# Patient Record
Sex: Male | Born: 1990 | Race: Black or African American | Hispanic: No | Marital: Single | Smoking: Never smoker
Health system: Southern US, Community
[De-identification: ages and names within clinical notes are randomized; demographics above are authoritative.]

---
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
## 2016-03-11 ENCOUNTER — Encounter (HOSPITAL_COMMUNITY): Payer: Self-pay | Admitting: Emergency Medicine

## 2016-03-11 ENCOUNTER — Ambulatory Visit (HOSPITAL_COMMUNITY)
Admission: EM | Admit: 2016-03-11 | Discharge: 2016-03-11 | Disposition: A | Discharge status: Home / Self Care | Payer: 59 | Attending: Family Medicine | Admitting: Family Medicine

## 2016-03-11 DIAGNOSIS — K529 Noninfective gastroenteritis and colitis, unspecified: Secondary | ICD-10-CM | POA: Diagnosis not present

## 2016-03-11 NOTE — Discharge Instructions (Signed)
If you develop fevers, you are unable to stay hydrated, your abdominal pain worsens, or you are vomiting greater amounts of blood or blood in your stool, please seek immediate medical attention.

## 2016-03-11 NOTE — ED Provider Notes (Signed)
CSN: [id number]     Arrival date & time 03/11/16  1259 History   First MD Initiated Contact with Patient 03/11/16 1431     Chief Complaint  Patient presents with  . Abdominal Pain  . Nausea  . Emesis   (Consider location/radiation/quality/duration/timing/severity/associated sxs/prior Treatment) HPI  Patient reports nausea with vomiting x2 yesterday and once this morning.  He reports mid to left sided abdominal pain that is improving but still present.  No know sick contacts.  May have eaten something left out too long on Sunday (ground [place]).  He was the only one who ate food.  Denies fevers, chills, diarrhea, dysuria, hematuria, headaches, weakness.  Vomit with scant specks of blood the second time he vomit.  He is able to stay hydrated.  He was able to tolerate rice at work last evening.  Denies nausea right now.  He's been trying to keep cool and eat bland things to settle his stomach.  Patient is borderline DM.  No ETOH, drugs or smoking.  History reviewed. No pertinent past medical history. History reviewed. No pertinent surgical history. History reviewed. No pertinent family history. Social History  Substance Use Topics  . Smoking status: Never Smoker  . Smokeless tobacco: Never Used  . Alcohol use No    Review of Systems  Constitutional: Positive for appetite change. Negative for chills, diaphoresis, fatigue and fever.  Eyes: Negative for visual disturbance.  Gastrointestinal: Positive for abdominal pain (improving), nausea (now resolved) and vomiting (now resolved). Negative for blood in stool, constipation and diarrhea.  Genitourinary: Negative for dysuria, frequency and hematuria.  Skin: Negative for rash.  Neurological: Negative for weakness and headaches.    Allergies  Review of patient's allergies indicates no known allergies.  Home Medications   Prior to Admission medications   Not on File   Meds Ordered and Administered this Visit  Medications - No data to  display  BP 130/74 (BP Location: Left Arm)   Pulse 65   Temp 98 F (36.7 C) (Oral)   Resp 18   Ht 6' (1.829 m)   Wt 260 lb (117.9 kg)   SpO2 99%   BMI 35.26 kg/m  No data found.   Physical Exam  Constitutional: He is oriented to person, place, and time. He appears well-developed and well-nourished. No distress.  obese  HENT:  Head: Normocephalic and atraumatic.  Eyes: Conjunctivae and EOM are normal.  Neck: Normal range of motion.  Cardiovascular: Normal rate, regular rhythm and normal heart sounds.   No murmur heard. Pulmonary/Chest: Effort normal and breath sounds normal. No respiratory distress.  Abdominal: Soft. Bowel sounds are normal. He exhibits no distension and no mass. There is no tenderness. There is no rebound and no guarding.  protuberant   Musculoskeletal: Normal range of motion.  Neurological: He is alert and oriented to person, place, and time.  Skin: Skin is warm. He is not diaphoretic.  Psychiatric: He has a normal mood and affect.  Poor eye contact    Urgent Care Course   Clinical Course   Procedures (including critical care time)  Labs Review Labs Reviewed - No data to display  Imaging Review No results found.  MDM   1. Gastroenteritis    Edward Woods is a 25 y.o. male that [facility] 2 days of nausea, vomiting, abdominal pain.  Upon exam, symptoms had resolved.  He wanted to be evaluated to be cleared for work because he works in [employer].  Patient afebrile here and asymptomatic.  His exam was unremarkable.  Possible food borne illness (reported left out ground [place]) vs viral gastroenteritis.  Work note provided excusing for missed work yesterday and allowing to return today.  Discussed washing hands frequently.  Reviewed strict return precautions, see AVS.  This patient was discussed with Edward Rasmussenavid Mabe, NP, who agrees with my assessment and plan.  Edward Meditz M. Nadine CountsGottschalk, DO PGY-3, [facility]         Raliegh IpAshly M Labib Cwynar, DO 03/11/16 [phone]

## 2016-03-11 NOTE — ED Triage Notes (Signed)
Pt reports waking up yesterday with nausea and some mild, dull abdominal pain.  He states he vomited at work twice yesterday and was sent home and then vomited again this morning.  Pt needs a note so he can go back to work.  He denies any nausea at this time, but still has a dull ache in his abdomen.

## 2016-05-06 ENCOUNTER — Ambulatory Visit (HOSPITAL_COMMUNITY)
Admission: EM | Admit: 2016-05-06 | Discharge: 2016-05-06 | Disposition: A | Discharge status: Home / Self Care | Payer: Worker's Compensation | Attending: Family Medicine | Admitting: Family Medicine

## 2016-05-06 ENCOUNTER — Encounter (HOSPITAL_COMMUNITY): Payer: Self-pay | Admitting: Emergency Medicine

## 2016-05-06 DIAGNOSIS — T23002A Burn of unspecified degree of left hand, unspecified site, initial encounter: Secondary | ICD-10-CM

## 2016-05-06 DIAGNOSIS — Z23 Encounter for immunization: Secondary | ICD-10-CM

## 2016-05-06 MED ORDER — TETANUS-DIPHTH-ACELL PERTUSSIS 5-2.5-18.5 LF-MCG/0.5 IM SUSP
INTRAMUSCULAR | Status: AC
  Filled 2016-05-06: qty 0.5

## 2016-05-06 MED ORDER — TETANUS-DIPHTH-ACELL PERTUSSIS 5-2.5-18.5 LF-MCG/0.5 IM SUSP
0.5000 mL | Freq: Once | INTRAMUSCULAR | Status: AC
  Administered 2016-05-06: 0.5 mL via INTRAMUSCULAR

## 2016-05-06 MED ORDER — SILVER SULFADIAZINE 1 % EX CREA: 1.0000 "application " | TOPICAL_CREAM | Freq: Every day | CUTANEOUS | 0 refills | Status: AC
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
## 2018-07-17 ENCOUNTER — Other Ambulatory Visit: Payer: Self-pay

## 2018-07-17 ENCOUNTER — Emergency Department (HOSPITAL_COMMUNITY)
Admission: EM | Admit: 2018-07-17 | Discharge: 2018-07-17 | Disposition: A | Discharge status: Home / Self Care | Payer: Self-pay | Attending: Emergency Medicine | Admitting: Emergency Medicine

## 2018-07-17 ENCOUNTER — Emergency Department (HOSPITAL_COMMUNITY): Payer: Self-pay

## 2018-07-17 DIAGNOSIS — M25571 Pain in right ankle and joints of right foot: Secondary | ICD-10-CM | POA: Insufficient documentation

## 2018-07-17 DIAGNOSIS — F909 Attention-deficit hyperactivity disorder, unspecified type: Secondary | ICD-10-CM | POA: Insufficient documentation

## 2018-07-17 NOTE — ED Notes (Signed)
Patient verbalizes understanding of discharge instructions. Opportunity for questioning and answers were provided. Armband removed by staff, pt discharged from ED.  

## 2018-07-17 NOTE — Discharge Instructions (Signed)
Elevate and ice when you can.  Take ibuprofen or aleve for the pain
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
## 2019-12-01 ENCOUNTER — Inpatient Hospital Stay: Payer: Self-pay

## 2019-12-01 ENCOUNTER — Inpatient Hospital Stay (HOSPITAL_COMMUNITY): Payer: Medicaid Other

## 2019-12-01 ENCOUNTER — Inpatient Hospital Stay (HOSPITAL_COMMUNITY)
Admission: EM | Admit: 2019-12-01 | Discharge: 2019-12-20 | DRG: 871 | Disposition: E | Payer: Medicaid Other | Attending: Pulmonary Disease | Admitting: Pulmonary Disease

## 2019-12-01 ENCOUNTER — Emergency Department (HOSPITAL_COMMUNITY): Payer: Medicaid Other

## 2019-12-01 ENCOUNTER — Encounter (HOSPITAL_COMMUNITY): Payer: Self-pay | Admitting: Emergency Medicine

## 2019-12-01 ENCOUNTER — Other Ambulatory Visit: Payer: Self-pay

## 2019-12-01 DIAGNOSIS — K8581 Other acute pancreatitis with uninfected necrosis: Secondary | ICD-10-CM | POA: Diagnosis present

## 2019-12-01 DIAGNOSIS — E875 Hyperkalemia: Secondary | ICD-10-CM

## 2019-12-01 DIAGNOSIS — E860 Dehydration: Secondary | ICD-10-CM | POA: Diagnosis present

## 2019-12-01 DIAGNOSIS — E781 Pure hyperglyceridemia: Secondary | ICD-10-CM | POA: Diagnosis present

## 2019-12-01 DIAGNOSIS — Z20822 Contact with and (suspected) exposure to covid-19: Secondary | ICD-10-CM | POA: Diagnosis present

## 2019-12-01 DIAGNOSIS — R6521 Severe sepsis with septic shock: Secondary | ICD-10-CM | POA: Diagnosis present

## 2019-12-01 DIAGNOSIS — Z4659 Encounter for fitting and adjustment of other gastrointestinal appliance and device: Secondary | ICD-10-CM

## 2019-12-01 DIAGNOSIS — A419 Sepsis, unspecified organism: Principal | ICD-10-CM | POA: Diagnosis present

## 2019-12-01 DIAGNOSIS — F909 Attention-deficit hyperactivity disorder, unspecified type: Secondary | ICD-10-CM | POA: Diagnosis present

## 2019-12-01 DIAGNOSIS — Z538 Procedure and treatment not carried out for other reasons: Secondary | ICD-10-CM | POA: Diagnosis present

## 2019-12-01 DIAGNOSIS — R571 Hypovolemic shock: Secondary | ICD-10-CM | POA: Diagnosis present

## 2019-12-01 DIAGNOSIS — K7290 Hepatic failure, unspecified without coma: Secondary | ICD-10-CM | POA: Diagnosis present

## 2019-12-01 DIAGNOSIS — R748 Abnormal levels of other serum enzymes: Secondary | ICD-10-CM

## 2019-12-01 DIAGNOSIS — K859 Acute pancreatitis without necrosis or infection, unspecified: Secondary | ICD-10-CM

## 2019-12-01 DIAGNOSIS — E870 Hyperosmolality and hypernatremia: Secondary | ICD-10-CM | POA: Diagnosis present

## 2019-12-01 DIAGNOSIS — Z66 Do not resuscitate: Secondary | ICD-10-CM | POA: Diagnosis not present

## 2019-12-01 DIAGNOSIS — Z6841 Body Mass Index (BMI) 40.0 and over, adult: Secondary | ICD-10-CM

## 2019-12-01 DIAGNOSIS — Z452 Encounter for adjustment and management of vascular access device: Secondary | ICD-10-CM

## 2019-12-01 DIAGNOSIS — E43 Unspecified severe protein-calorie malnutrition: Secondary | ICD-10-CM | POA: Diagnosis present

## 2019-12-01 DIAGNOSIS — E1110 Type 2 diabetes mellitus with ketoacidosis without coma: Secondary | ICD-10-CM

## 2019-12-01 DIAGNOSIS — G9340 Encephalopathy, unspecified: Secondary | ICD-10-CM | POA: Insufficient documentation

## 2019-12-01 DIAGNOSIS — G9341 Metabolic encephalopathy: Secondary | ICD-10-CM | POA: Diagnosis present

## 2019-12-01 DIAGNOSIS — N170 Acute kidney failure with tubular necrosis: Secondary | ICD-10-CM | POA: Diagnosis present

## 2019-12-01 DIAGNOSIS — Z978 Presence of other specified devices: Secondary | ICD-10-CM

## 2019-12-01 DIAGNOSIS — E11649 Type 2 diabetes mellitus with hypoglycemia without coma: Secondary | ICD-10-CM | POA: Diagnosis not present

## 2019-12-01 DIAGNOSIS — K760 Fatty (change of) liver, not elsewhere classified: Secondary | ICD-10-CM | POA: Diagnosis present

## 2019-12-01 DIAGNOSIS — Z01818 Encounter for other preprocedural examination: Secondary | ICD-10-CM

## 2019-12-01 DIAGNOSIS — D696 Thrombocytopenia, unspecified: Secondary | ICD-10-CM | POA: Diagnosis present

## 2019-12-01 DIAGNOSIS — N179 Acute kidney failure, unspecified: Secondary | ICD-10-CM

## 2019-12-01 DIAGNOSIS — J9601 Acute respiratory failure with hypoxia: Secondary | ICD-10-CM | POA: Diagnosis not present

## 2019-12-01 DIAGNOSIS — J9600 Acute respiratory failure, unspecified whether with hypoxia or hypercapnia: Secondary | ICD-10-CM

## 2019-12-01 DIAGNOSIS — I480 Paroxysmal atrial fibrillation: Secondary | ICD-10-CM | POA: Diagnosis not present

## 2019-12-01 DIAGNOSIS — E8770 Fluid overload, unspecified: Secondary | ICD-10-CM | POA: Diagnosis not present

## 2019-12-01 DIAGNOSIS — Z79899 Other long term (current) drug therapy: Secondary | ICD-10-CM

## 2019-12-01 LAB — CBC
HCT: 54.9 % — ABNORMAL HIGH (ref 39.0–52.0)
Hemoglobin: 17.8 g/dL — ABNORMAL HIGH (ref 13.0–17.0)
MCH: 28.2 pg (ref 26.0–34.0)
MCHC: 32.4 g/dL (ref 30.0–36.0)
MCV: 86.9 fL (ref 80.0–100.0)
Platelets: 462 10*3/uL — ABNORMAL HIGH (ref 150–400)
RBC: 6.32 MIL/uL — ABNORMAL HIGH (ref 4.22–5.81)
RDW: 14.9 % (ref 11.5–15.5)
WBC: 18.9 10*3/uL — ABNORMAL HIGH (ref 4.0–10.5)
nRBC: 0 % (ref 0.0–0.2)

## 2019-12-01 LAB — BLOOD GAS, ARTERIAL
Acid-base deficit: 13.3 mmol/L — ABNORMAL HIGH (ref 0.0–2.0)
Bicarbonate: 12.4 mmol/L — ABNORMAL LOW (ref 20.0–28.0)
FIO2: 21
O2 Saturation: 95.3 %
Patient temperature: 98.7
pCO2 arterial: 28.7 mmHg — ABNORMAL LOW (ref 32.0–48.0)
pH, Arterial: 7.258 — ABNORMAL LOW (ref 7.350–7.450)
pO2, Arterial: 83.6 mmHg (ref 83.0–108.0)

## 2019-12-01 LAB — URINALYSIS, ROUTINE W REFLEX MICROSCOPIC
Bacteria, UA: NONE SEEN
Bilirubin Urine: NEGATIVE
Glucose, UA: >=500 mg/dL — AB
Hgb urine dipstick: NEGATIVE
Ketones, ur: 80 mg/dL — AB
Leukocytes,Ua: NEGATIVE
Nitrite: NEGATIVE
Protein, ur: >=300 mg/dL — AB
Specific Gravity, Urine: 1.033 — ABNORMAL HIGH (ref 1.005–1.030)
pH: 6 (ref 5.0–8.0)

## 2019-12-01 LAB — BASIC METABOLIC PANEL
Anion gap: 31 — ABNORMAL HIGH (ref 5–15)
Anion gap: 27 — ABNORMAL HIGH (ref 5–15)
Anion gap: 24 — ABNORMAL HIGH (ref 5–15)
Anion gap: 16 — ABNORMAL HIGH (ref 5–15)
BUN: 31 mg/dL — ABNORMAL HIGH (ref 6–20)
BUN: 33 mg/dL — ABNORMAL HIGH (ref 6–20)
BUN: 31 mg/dL — ABNORMAL HIGH (ref 6–20)
BUN: 33 mg/dL — ABNORMAL HIGH (ref 6–20)
CO2: 10 mmol/L — ABNORMAL LOW (ref 22–32)
CO2: 9 mmol/L — ABNORMAL LOW (ref 22–32)
CO2: 11 mmol/L — ABNORMAL LOW (ref 22–32)
CO2: 17 mmol/L — ABNORMAL LOW (ref 22–32)
Calcium: 10.2 mg/dL (ref 8.9–10.3)
Calcium: 8.7 mg/dL — ABNORMAL LOW (ref 8.9–10.3)
Calcium: 8.6 mg/dL — ABNORMAL LOW (ref 8.9–10.3)
Calcium: 9 mg/dL (ref 8.9–10.3)
Chloride: 99 mmol/L (ref 98–111)
Chloride: 110 mmol/L (ref 98–111)
Chloride: 115 mmol/L — ABNORMAL HIGH (ref 98–111)
Chloride: 122 mmol/L — ABNORMAL HIGH (ref 98–111)
Creatinine, Ser: 3.11 mg/dL — ABNORMAL HIGH (ref 0.61–1.24)
Creatinine, Ser: 2.46 mg/dL — ABNORMAL HIGH (ref 0.61–1.24)
Creatinine, Ser: 2.33 mg/dL — ABNORMAL HIGH (ref 0.61–1.24)
Creatinine, Ser: 3.36 mg/dL — ABNORMAL HIGH (ref 0.61–1.24)
GFR calc Af Amer: 30 mL/min — ABNORMAL LOW (ref >60)
GFR calc Af Amer: 40 mL/min — ABNORMAL LOW (ref >60)
GFR calc Af Amer: 43 mL/min — ABNORMAL LOW (ref >60)
GFR calc Af Amer: 27 mL/min — ABNORMAL LOW (ref >60)
GFR calc non Af Amer: 26 mL/min — ABNORMAL LOW (ref >60)
GFR calc non Af Amer: 35 mL/min — ABNORMAL LOW (ref >60)
GFR calc non Af Amer: 37 mL/min — ABNORMAL LOW (ref >60)
GFR calc non Af Amer: 24 mL/min — ABNORMAL LOW (ref >60)
Glucose, Bld: 926 mg/dL (ref 70–99)
Glucose, Bld: 736 mg/dL (ref 70–99)
Glucose, Bld: 519 mg/dL (ref 70–99)
Glucose, Bld: 290 mg/dL — ABNORMAL HIGH (ref 70–99)
Potassium: 6 mmol/L — ABNORMAL HIGH (ref 3.5–5.1)
Potassium: 4.5 mmol/L (ref 3.5–5.1)
Potassium: 4 mmol/L (ref 3.5–5.1)
Potassium: 3.9 mmol/L (ref 3.5–5.1)
Sodium: 140 mmol/L (ref 135–145)
Sodium: 146 mmol/L — ABNORMAL HIGH (ref 135–145)
Sodium: 150 mmol/L — ABNORMAL HIGH (ref 135–145)
Sodium: 155 mmol/L — ABNORMAL HIGH (ref 135–145)

## 2019-12-01 LAB — CBG MONITORING, ED
Glucose-Capillary: >600 mg/dL (ref 70–99)
Glucose-Capillary: >600 mg/dL (ref 70–99)
Glucose-Capillary: >600 mg/dL (ref 70–99)
Glucose-Capillary: >600 mg/dL (ref 70–99)
Glucose-Capillary: >600 mg/dL (ref 70–99)
Glucose-Capillary: 546 mg/dL (ref 70–99)
Glucose-Capillary: 548 mg/dL (ref 70–99)
Glucose-Capillary: 459 mg/dL — ABNORMAL HIGH (ref 70–99)
Glucose-Capillary: 410 mg/dL — ABNORMAL HIGH (ref 70–99)
Glucose-Capillary: 450 mg/dL — ABNORMAL HIGH (ref 70–99)
Glucose-Capillary: 384 mg/dL — ABNORMAL HIGH (ref 70–99)
Glucose-Capillary: 412 mg/dL — ABNORMAL HIGH (ref 70–99)
Glucose-Capillary: 308 mg/dL — ABNORMAL HIGH (ref 70–99)
Glucose-Capillary: 242 mg/dL — ABNORMAL HIGH (ref 70–99)
Glucose-Capillary: 234 mg/dL — ABNORMAL HIGH (ref 70–99)
Glucose-Capillary: 245 mg/dL — ABNORMAL HIGH (ref 70–99)
Glucose-Capillary: 256 mg/dL — ABNORMAL HIGH (ref 70–99)
Glucose-Capillary: 296 mg/dL — ABNORMAL HIGH (ref 70–99)
Glucose-Capillary: 308 mg/dL — ABNORMAL HIGH (ref 70–99)

## 2019-12-01 LAB — BLOOD GAS, VENOUS
Acid-base deficit: 20.1 mmol/L — ABNORMAL HIGH (ref 0.0–2.0)
Acid-base deficit: 21.3 mmol/L — ABNORMAL HIGH (ref 0.0–2.0)
Acid-base deficit: 15.4 mmol/L — ABNORMAL HIGH (ref 0.0–2.0)
Bicarbonate: 10.4 mmol/L — ABNORMAL LOW (ref 20.0–28.0)
Bicarbonate: 8.5 mmol/L — ABNORMAL LOW (ref 20.0–28.0)
Bicarbonate: 12.3 mmol/L — ABNORMAL LOW (ref 20.0–28.0)
FIO2: 21
FIO2: 21
O2 Saturation: 69.1 %
O2 Saturation: 76.3 %
O2 Saturation: 70.8 %
Patient temperature: 98.7
Patient temperature: 98.6
Patient temperature: 98.6
pCO2, Ven: 35.6 mmHg — ABNORMAL LOW (ref 44.0–60.0)
pCO2, Ven: 29.4 mmHg — ABNORMAL LOW (ref 44.0–60.0)
pCO2, Ven: 34 mmHg — ABNORMAL LOW (ref 44.0–60.0)
pH, Ven: 7.093 — CL (ref 7.250–7.430)
pH, Ven: 7.092 — CL (ref 7.250–7.430)
pH, Ven: 7.185 — CL (ref 7.250–7.430)
pO2, Ven: 46.3 mmHg — ABNORMAL HIGH (ref 32.0–45.0)
pO2, Ven: 56.5 mmHg — ABNORMAL HIGH (ref 32.0–45.0)
pO2, Ven: 42 mmHg (ref 32.0–45.0)

## 2019-12-01 LAB — PHOSPHORUS
Phosphorus: 2.8 mg/dL (ref 2.5–4.6)
Phosphorus: 2.1 mg/dL — ABNORMAL LOW (ref 2.5–4.6)

## 2019-12-01 LAB — COMPREHENSIVE METABOLIC PANEL
ALT: 50 U/L — ABNORMAL HIGH (ref 0–44)
ALT: 35 U/L (ref 0–44)
AST: 28 U/L (ref 15–41)
AST: 36 U/L (ref 15–41)
Albumin: 4.8 g/dL (ref 3.5–5.0)
Albumin: 3.5 g/dL (ref 3.5–5.0)
Alkaline Phosphatase: 97 U/L (ref 38–126)
Alkaline Phosphatase: 68 U/L (ref 38–126)
Anion gap: 30 — ABNORMAL HIGH (ref 5–15)
Anion gap: 18 — ABNORMAL HIGH (ref 5–15)
BUN: 32 mg/dL — ABNORMAL HIGH (ref 6–20)
BUN: 39 mg/dL — ABNORMAL HIGH (ref 6–20)
CO2: 9 mmol/L — ABNORMAL LOW (ref 22–32)
CO2: 13 mmol/L — ABNORMAL LOW (ref 22–32)
Calcium: 10.3 mg/dL (ref 8.9–10.3)
Calcium: 8.7 mg/dL — ABNORMAL LOW (ref 8.9–10.3)
Chloride: 101 mmol/L (ref 98–111)
Chloride: 120 mmol/L — ABNORMAL HIGH (ref 98–111)
Creatinine, Ser: 2.78 mg/dL — ABNORMAL HIGH (ref 0.61–1.24)
Creatinine, Ser: 4.56 mg/dL — ABNORMAL HIGH (ref 0.61–1.24)
GFR calc Af Amer: 35 mL/min — ABNORMAL LOW (ref >60)
GFR calc Af Amer: 19 mL/min — ABNORMAL LOW (ref >60)
GFR calc non Af Amer: 30 mL/min — ABNORMAL LOW (ref >60)
GFR calc non Af Amer: 16 mL/min — ABNORMAL LOW (ref >60)
Glucose, Bld: 908 mg/dL (ref 70–99)
Glucose, Bld: 415 mg/dL — ABNORMAL HIGH (ref 70–99)
Potassium: 6.5 mmol/L (ref 3.5–5.1)
Potassium: 4.4 mmol/L (ref 3.5–5.1)
Sodium: 140 mmol/L (ref 135–145)
Sodium: 151 mmol/L — ABNORMAL HIGH (ref 135–145)
Total Bilirubin: 2.4 mg/dL — ABNORMAL HIGH (ref 0.3–1.2)
Total Bilirubin: 1.4 mg/dL — ABNORMAL HIGH (ref 0.3–1.2)
Total Protein: 9.7 g/dL — ABNORMAL HIGH (ref 6.5–8.1)
Total Protein: 7.2 g/dL (ref 6.5–8.1)

## 2019-12-01 LAB — CK: Total CK: 326 U/L (ref 49–397)

## 2019-12-01 LAB — TRIGLYCERIDES: Triglycerides: 1136 mg/dL — ABNORMAL HIGH (ref <150)

## 2019-12-01 LAB — HEMOGLOBIN A1C
Hgb A1c MFr Bld: 11.6 % — ABNORMAL HIGH (ref 4.8–5.6)
Mean Plasma Glucose: 286.22 mg/dL

## 2019-12-01 LAB — LACTIC ACID, PLASMA
Lactic Acid, Venous: 3.9 mmol/L (ref 0.5–1.9)
Lactic Acid, Venous: 3.6 mmol/L (ref 0.5–1.9)
Lactic Acid, Venous: 3.2 mmol/L (ref 0.5–1.9)
Lactic Acid, Venous: 5 mmol/L (ref 0.5–1.9)
Lactic Acid, Venous: 4 mmol/L (ref 0.5–1.9)

## 2019-12-01 LAB — CBC WITH DIFFERENTIAL/PLATELET
Abs Immature Granulocytes: 0.12 10*3/uL — ABNORMAL HIGH (ref 0.00–0.07)
Basophils Absolute: 0.1 10*3/uL (ref 0.0–0.1)
Basophils Relative: 0 %
Eosinophils Absolute: 0 10*3/uL (ref 0.0–0.5)
Eosinophils Relative: 0 %
HCT: 57.4 % — ABNORMAL HIGH (ref 39.0–52.0)
Hemoglobin: 18.6 g/dL — ABNORMAL HIGH (ref 13.0–17.0)
Immature Granulocytes: 1 %
Lymphocytes Relative: 10 %
Lymphs Abs: 2.1 10*3/uL (ref 0.7–4.0)
MCH: 28.1 pg (ref 26.0–34.0)
MCHC: 32.4 g/dL (ref 30.0–36.0)
MCV: 86.7 fL (ref 80.0–100.0)
Monocytes Absolute: 1.5 10*3/uL — ABNORMAL HIGH (ref 0.1–1.0)
Monocytes Relative: 8 %
Neutro Abs: 16.7 10*3/uL — ABNORMAL HIGH (ref 1.7–7.7)
Neutrophils Relative %: 81 %
Platelets: 540 10*3/uL — ABNORMAL HIGH (ref 150–400)
RBC: 6.62 MIL/uL — ABNORMAL HIGH (ref 4.22–5.81)
RDW: 15.4 % (ref 11.5–15.5)
WBC: 20.5 10*3/uL — ABNORMAL HIGH (ref 4.0–10.5)
nRBC: 0 % (ref 0.0–0.2)

## 2019-12-01 LAB — LIPASE, BLOOD: Lipase: 342 U/L — ABNORMAL HIGH (ref 11–51)

## 2019-12-01 LAB — MAGNESIUM
Magnesium: 3.1 mg/dL — ABNORMAL HIGH (ref 1.7–2.4)
Magnesium: 2.7 mg/dL — ABNORMAL HIGH (ref 1.7–2.4)

## 2019-12-01 LAB — BETA-HYDROXYBUTYRIC ACID
Beta-Hydroxybutyric Acid: >8 mmol/L — ABNORMAL HIGH (ref 0.05–0.27)
Beta-Hydroxybutyric Acid: 3.19 mmol/L — ABNORMAL HIGH (ref 0.05–0.27)

## 2019-12-01 LAB — SARS CORONAVIRUS 2 BY RT PCR (HOSPITAL ORDER, PERFORMED IN ~~LOC~~ HOSPITAL LAB): SARS Coronavirus 2: NEGATIVE

## 2019-12-01 LAB — HIV ANTIBODY (ROUTINE TESTING W REFLEX): HIV Screen 4th Generation wRfx: NONREACTIVE

## 2019-12-01 LAB — D-DIMER, QUANTITATIVE: D-Dimer, Quant: 0.62 ug/mL-FEU — ABNORMAL HIGH (ref 0.00–0.50)

## 2019-12-01 LAB — GLUCOSE, CAPILLARY
Glucose-Capillary: 387 mg/dL — ABNORMAL HIGH (ref 70–99)
Glucose-Capillary: 329 mg/dL — ABNORMAL HIGH (ref 70–99)

## 2019-12-01 LAB — TROPONIN I (HIGH SENSITIVITY)
Troponin I (High Sensitivity): 4 ng/L (ref <18)
Troponin I (High Sensitivity): 4 ng/L (ref <18)

## 2019-12-01 MED ORDER — LACTATED RINGERS IV BOLUS
500.0000 mL | Freq: Once | INTRAVENOUS | Status: AC
  Administered 2019-12-01: 500 mL via INTRAVENOUS

## 2019-12-01 MED ORDER — LACTATED RINGERS IV BOLUS: 1000.0000 mL | Freq: Once | INTRAVENOUS | Status: DC

## 2019-12-01 MED ORDER — POTASSIUM CHLORIDE 10 MEQ/100ML IV SOLN
10.0000 meq | INTRAVENOUS | Status: AC
  Administered 2019-12-01 (×2): 10 meq via INTRAVENOUS
  Filled 2019-12-01: qty 100

## 2019-12-01 MED ORDER — INSULIN REGULAR(HUMAN) IN NACL 100-0.9 UT/100ML-% IV SOLN
INTRAVENOUS | Status: DC
  Administered 2019-12-01: 17 [IU]/h via INTRAVENOUS
  Administered 2019-12-01: 5 [IU]/h via INTRAVENOUS
  Administered 2019-12-02: 15 [IU]/h via INTRAVENOUS
  Filled 2019-12-01 (×5): qty 100

## 2019-12-01 MED ORDER — LACTATED RINGERS IV BOLUS
1000.0000 mL | INTRAVENOUS | Status: DC
  Administered 2019-12-01: 800 mL via INTRAVENOUS
  Administered 2019-12-01: 1000 mL via INTRAVENOUS

## 2019-12-01 MED ORDER — SODIUM CHLORIDE 0.9 % IV SOLN
1.0000 g | Freq: Two times a day (BID) | INTRAVENOUS | Status: DC
  Administered 2019-12-02 (×2): 1 g via INTRAVENOUS
  Filled 2019-12-01 (×3): qty 1

## 2019-12-01 MED ORDER — ONDANSETRON HCL 4 MG/2ML IJ SOLN
4.0000 mg | Freq: Four times a day (QID) | INTRAMUSCULAR | Status: DC | PRN
  Administered 2019-12-01 – 2019-12-04 (×3): 4 mg via INTRAVENOUS
  Filled 2019-12-01 (×3): qty 2

## 2019-12-01 MED ORDER — ONDANSETRON HCL 4 MG/2ML IJ SOLN
4.0000 mg | Freq: Once | INTRAMUSCULAR | Status: AC
  Administered 2019-12-01: 4 mg via INTRAVENOUS
  Filled 2019-12-01: qty 2

## 2019-12-01 MED ORDER — SODIUM CHLORIDE 0.9 % IV BOLUS: 1000.0000 mL | Freq: Once | INTRAVENOUS | Status: DC

## 2019-12-01 MED ORDER — SODIUM CHLORIDE 0.9 % IV SOLN: INTRAVENOUS | Status: DC

## 2019-12-01 MED ORDER — SODIUM CHLORIDE 0.9 % IV SOLN
250.0000 mL | INTRAVENOUS | Status: DC
  Administered 2019-12-05 (×2): 250 mL via INTRAVENOUS

## 2019-12-01 MED ORDER — LACTATED RINGERS IV BOLUS
2000.0000 mL | Freq: Once | INTRAVENOUS | Status: AC
  Administered 2019-12-01: 2000 mL via INTRAVENOUS

## 2019-12-01 MED ORDER — PIPERACILLIN-TAZOBACTAM 3.375 G IVPB
3.3750 g | Freq: Three times a day (TID) | INTRAVENOUS | Status: DC
  Administered 2019-12-01: 3.375 g via INTRAVENOUS
  Filled 2019-12-01: qty 50

## 2019-12-01 MED ORDER — MORPHINE SULFATE (PF) 2 MG/ML IV SOLN: 2.0000 mg | INTRAVENOUS | Status: DC | PRN

## 2019-12-01 MED ORDER — DEXTROSE 50 % IV SOLN: 0.0000 mL | INTRAVENOUS | Status: DC | PRN

## 2019-12-01 MED ORDER — INSULIN ASPART 100 UNIT/ML ~~LOC~~ SOLN
10.0000 [IU] | Freq: Once | SUBCUTANEOUS | Status: AC
  Administered 2019-12-01: 10 [IU] via INTRAVENOUS
  Filled 2019-12-01: qty 0.1

## 2019-12-01 MED ORDER — LACTATED RINGERS IV BOLUS
1000.0000 mL | Freq: Once | INTRAVENOUS | Status: AC
  Administered 2019-12-01: 1000 mL via INTRAVENOUS

## 2019-12-01 MED ORDER — PANTOPRAZOLE SODIUM 40 MG IV SOLR
40.0000 mg | INTRAVENOUS | Status: DC
  Administered 2019-12-01 – 2019-12-04 (×4): 40 mg via INTRAVENOUS
  Filled 2019-12-01 (×5): qty 40

## 2019-12-01 MED ORDER — SODIUM CHLORIDE 0.9 % IV BOLUS
1000.0000 mL | Freq: Once | INTRAVENOUS | Status: AC
  Administered 2019-12-01: 1000 mL via INTRAVENOUS

## 2019-12-01 MED ORDER — ENOXAPARIN SODIUM 40 MG/0.4ML ~~LOC~~ SOLN
40.0000 mg | SUBCUTANEOUS | Status: DC
  Administered 2019-12-01: 40 mg via SUBCUTANEOUS
  Filled 2019-12-01: qty 0.4

## 2019-12-01 MED ORDER — NOREPINEPHRINE 16 MG/250ML-% IV SOLN
0.0000 ug/min | INTRAVENOUS | Status: DC
  Administered 2019-12-01: 35 ug/min via INTRAVENOUS
  Administered 2019-12-02: 40 ug/min via INTRAVENOUS
  Administered 2019-12-02: 39 ug/min via INTRAVENOUS
  Administered 2019-12-02: 39.5 ug/min via INTRAVENOUS
  Administered 2019-12-03: 34 ug/min via INTRAVENOUS
  Administered 2019-12-03: 20 ug/min via INTRAVENOUS
  Administered 2019-12-04: 35 ug/min via INTRAVENOUS
  Administered 2019-12-04 (×2): 80 ug/min via INTRAVENOUS
  Administered 2019-12-04: 40 ug/min via INTRAVENOUS
  Administered 2019-12-05: 80 ug/min via INTRAVENOUS
  Administered 2019-12-05: 60 ug/min via INTRAVENOUS
  Administered 2019-12-05 (×3): 80 ug/min via INTRAVENOUS
  Administered 2019-12-06: 70 ug/min via INTRAVENOUS
  Administered 2019-12-06: 59 ug/min via INTRAVENOUS
  Administered 2019-12-06: 90 ug/min via INTRAVENOUS
  Administered 2019-12-06: 50 ug/min via INTRAVENOUS
  Administered 2019-12-06 – 2019-12-07 (×5): 90 ug/min via INTRAVENOUS
  Administered 2019-12-07: 100 ug/min via INTRAVENOUS
  Administered 2019-12-07: 90 ug/min via INTRAVENOUS
  Administered 2019-12-07: 93 ug/min via INTRAVENOUS
  Administered 2019-12-07 – 2019-12-08 (×2): 100 ug/min via INTRAVENOUS
  Filled 2019-12-01 (×34): qty 250

## 2019-12-01 MED ORDER — FENTANYL CITRATE (PF) 100 MCG/2ML IJ SOLN
100.0000 ug | INTRAMUSCULAR | Status: DC | PRN
  Administered 2019-12-01: 100 ug via INTRAVENOUS
  Filled 2019-12-01: qty 2

## 2019-12-01 MED ORDER — NOREPINEPHRINE 4 MG/250ML-% IV SOLN
2.0000 ug/min | INTRAVENOUS | Status: DC
  Administered 2019-12-01: 5 ug/min via INTRAVENOUS
  Filled 2019-12-01 (×2): qty 250

## 2019-12-01 MED ORDER — SODIUM CHLORIDE 0.9 % IV SOLN: Freq: Once | INTRAVENOUS | Status: AC

## 2019-12-01 MED ORDER — SODIUM CHLORIDE 0.45 % IV SOLN: INTRAVENOUS | Status: DC

## 2019-12-01 MED ORDER — VASOPRESSIN 20 UNIT/ML IV SOLN
0.0400 [IU]/min | INTRAVENOUS | Status: DC
  Administered 2019-12-01: 0.03 [IU]/min via INTRAVENOUS
  Administered 2019-12-02 – 2019-12-04 (×3): 0.04 [IU]/min via INTRAVENOUS
  Filled 2019-12-01 (×6): qty 2

## 2019-12-01 MED ORDER — DEXTROSE-NACL 5-0.45 % IV SOLN: INTRAVENOUS | Status: DC

## 2019-12-01 MED ORDER — NOREPINEPHRINE 4 MG/250ML-% IV SOLN: 0.0000 ug/min | INTRAVENOUS | Status: DC

## 2019-12-01 NOTE — ED Notes (Signed)
Hospitalitist paged regarding pts blood pressure.
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
## 2019-12-01 NOTE — Progress Notes (Signed)
PICC order received.  Ian Malkin, RN made aware that PICC will be placed 5-14.

## 2019-12-01 NOTE — ED Notes (Signed)
Phlebotomy contacted for blood draw.

## 2019-12-01 NOTE — ED Notes (Signed)
Pt found on floor. 30 Minutes prior pt was A&O 4x, bed was reclined, and call light given to pt. Pt asked if he had any other needs, pt also reminded that he should call for anything.

## 2019-12-01 NOTE — H&P (Addendum)
History and Physical    Edward Woods BMW:[id number] DOB: Feb 14, 1991 DOA: Dec 16, 2019  PCP: Patient, No Pcp Per   Patient coming from: HOME  Chief Complaint: Increased thirst and urination  HPI: Edward Woods is a 29 y.o. male with no significant past medical history is to the ED for evaluation of multiple complaints including generalized weakness malaise, abdominal pain nausea,vomiting and excessive thirst and urination for last 2 to 3 days.  Patient also complains of abdominal pain which is diffuse with associated nausea and vomiting unable to keep down any food.  Reports "all I do is pee, drink and sleep for last few days".  He also complains of shortness of breath, diffuse pain all over and has eaten barely since Sunday.  He denies any diarrhea or focal weakness, hemoptysis, fever, chills, cough or any urinary symptoms.  Denies recent sick contacts or COVID-19 exposure.  ED Course: Patient was tachycardic, tachypneic this patient varying from 20s to 30s, heart rate 100-120s.  Chest x-ray no acute finding, EKG showed sinus tachycardia with ST depression inferior leads, incomplete bundle branch block.  Labs showed significant abnormalities with hyperkalemia 6.1 but with hemolysis, severely acidotic with lactic acidosis 30, AKI with creatinine 2.7 blood sugar 908, bicarb 9, with elevated 342 AST ALT stable but total bili elevated 2.4, CBC with leukocytosis 20.5.  And was a started on aggressive IV hydration and subsequently in the ED I had evaluated him was about to finish fourth liter of fluids lactate has slightly gotten better to 3.6.  Patient was progressively more somnolent was tachypneic tachycardic he was on room air saturating 95 to 97%.  He was placed on insulin drip after bolus 10 units.  Seen by critical care and ordered for additional 2 L Ringer's lactate, subsequent blood work showed blood sugar down in 300, bicarb 11, sodium 150, potassium 4.0, creatinine down to 2.3, lactic  acid down to, anion gap down to 24 creatinine 2.3 BUN still up 31.  Troponin negative at 4 days for D-dimer slightly up 0.6.  COVID-19 negative.  Underwent CT abdomen pelvis without contrast that showed diffuse pancreatic enlargement with extensive peripancreatic stranding and surrounding peripancreatic fluid, hepatic steatosis.   Review of Systems: Unable to obtain full review of system as patient is lethargic and sleepy. History reviewed. No pertinent past medical history.  History reviewed. No pertinent surgical history.   reports that he has never smoked. He has never used smokeless tobacco. He reports that he does not drink alcohol or use drugs.  No Known Allergies  History reviewed. No pertinent family history.  Prior to Admission medications   Medication Sig Start Date End Date Taking? Authorizing Provider  silver sulfADIAZINE (SILVADENE) 1 % cream Apply 1 application topically daily. Patient not taking: Reported on 12/16/2019 05/06/16   Elvina Sidle, MD   Physical Exam: Vitals:   12/16/19 0922 12/16/2019 1015 2019-12-16 1045 Dec 16, 2019 1100  BP: 111/88 (!) 163/113 (!) 158/42 (!) 166/49  Pulse: (!) 127 (!) 110 (!) 112 (!) 108  Resp: (!) 22 (!) 36 (!) 36 (!) 32  Temp:      TempSrc:      SpO2: 97% 91% 94% 90%  Weight:      Height:       General exam: Sleepy, lethargic on RA. HEENT:Oral mucosa moist,Ear/Nose WNL grossly,dentition normal. Respiratory system: bilaterally clear,no wheezing or crackles,no use of accessory muscle Cardiovascular system: S1 & S2 +,No JVD. Gastrointestinal system: Abdomen tender diffusely, Obese,BS slugish Nervous System: Moves  all his extremities, interactive follows command  Extremities: No edema, distal peripheral pulses palpable.  Skin: No rashes,no icterus. MSK: Normal muscle bulk,tone, power   Labs on Admission: I have personally reviewed following labs and imaging studies  CBC: Recent Labs  Lab 12/13/2019 0641  WBC 20.5*  NEUTROABS 16.7*   HGB 18.6*  HCT 57.4*  MCV 86.7  PLT 500*   Basic Metabolic Panel: Recent Labs  Lab 12/12/2019 0641 11/29/2019 0750 12/13/2019 1017  NA 140 140 146*  K 6.5* 6.0* 4.5  CL 101 99 110  CO2 9* 10* 9*  GLUCOSE 908* 926* 736*  BUN 32* 31* 33*  CREATININE 2.78* 3.11* 2.46*  CALCIUM 10.3 10.2 8.7*   GFR: Estimated Creatinine Clearance: 60.4 mL/min (A) (by C-G formula based on SCr of 2.46 mg/dL (H)). Liver Function Tests: Recent Labs  Lab 12/12/2019 0641  AST 28  ALT 50*  ALKPHOS 97  BILITOT 2.4*  PROT 9.7*  ALBUMIN 4.8   Recent Labs  Lab 12/07/2019 0641  LIPASE 342*   No results for input(s): AMMONIA in the last 168 hours. Coagulation Profile: No results for input(s): INR, PROTIME in the last 168 hours. Cardiac Enzymes: No results for input(s): CKTOTAL, CKMB, CKMBINDEX, TROPONINI in the last 168 hours. BNP (last 3 results) No results for input(s): PROBNP in the last 8760 hours. HbA1C: No results for input(s): HGBA1C in the last 72 hours. CBG: Recent Labs  Lab 12/06/2019 0926 12/07/2019 0959 11/27/2019 1027 11/29/2019 1057 12/15/2019 1128  GLUCAP >600* >600* >600* 548* 546*   Lipid Profile: No results for input(s): CHOL, HDL, LDLCALC, TRIG, CHOLHDL, LDLDIRECT in the last 72 hours. Thyroid Function Tests: No results for input(s): TSH, T4TOTAL, FREET4, T3FREE, THYROIDAB in the last 72 hours. Anemia Panel: No results for input(s): VITAMINB12, FOLATE, FERRITIN, TIBC, IRON, RETICCTPCT in the last 72 hours. Urine analysis:    Component Value Date/Time   COLORURINE AMBER (A) 12/07/2019 0559   APPEARANCEUR CLEAR 11/25/2019 0559   LABSPEC 1.033 (H) 12/06/2019 0559   PHURINE 6.0 11/26/2019 0559   GLUCOSEU >=500 (A) 12/15/2019 0559   HGBUR NEGATIVE 12/16/2019 0559   BILIRUBINUR NEGATIVE 12/04/2019 0559   KETONESUR 80 (A) 11/24/2019 0559   PROTEINUR >=300 (A) 11/27/2019 0559   NITRITE NEGATIVE 11/28/2019 0559   LEUKOCYTESUR NEGATIVE 12/02/2019 0559    Radiological Exams on  Admission: DG Chest Port 1 View  Result Date: 12/09/2019 CLINICAL DATA:  Shortness of breath EXAM: PORTABLE CHEST 1 VIEW COMPARISON:  None. FINDINGS: Normal heart size and mediastinal contours. Low volume chest no acute infiltrate or edema. No effusion or pneumothorax. No acute osseous findings. IMPRESSION: Negative low volume chest Electronically Signed   By: Monte Fantasia M.D.   On: 12/19/2019 06:27    Assessment/Plan DKA with new onset diabetes mellitus with anion gap metabolic acidosis: Critical care has been consulted and has advised admission on the stepdown unit we will continue on aggressive fluid hydration has received 4 L bolus already in the ED and critical care has advised 2 more liters of Ringer's lactate, continue IV fluids with hypotonic saline based upon sodium most recent labs with hyponatremia, continue serial BMP check, blood sugar check while on insulin, check hba1c, TG.  Consult diabetes coordinator.  AKI due to above, aggressive hydrated due to profound dehydration.  continue IV hydration creatinine slowly coming down, having urine output monitor intake output.  Profound dehydration due to DKA: Continue aggressive IV hydration as per DKA protocol ,switch to half-normal saline due to  hypernatremia.  Acute metabolic encephalopathy/somnolence: Patient is lethargic and somnolent able to move, interact some and follow instructions.  Likely from his severe metabolic derangement.  Monitor closely in SDU.  Consider CT scan of the head if does not improve quickly, but as day progresses he is more alert and awake with hydration.  Lactic acidosis due to DKA/dehydration: Lactic acid improving slowly with hydration.  Hyperkalemia with hemolysis, resolved: Potassium in 4s- so will replete with kcl based on insulin protocol  Acute pancreatitis with diffuse pancreatic enlargement with extensive peripancreatic stranding and surrounding peripancreatic fluid. Will check TG, RUQ [place].  Continue  n.p.o. continue aggressive IV fluid hydration, PPI, follow-up LFTs and lipase  Leukocytosis: In the setting of DKA also with pancreatitis.  We will add empiric antibiotic given peripancreatic fluid collection. Check blood culture.  Chest x-ray no acute finding, UA showed negative nitrate and leukocyte esterase. Quickly de-escalate antibiotics based upon blood culture.  Slightly elevated ALT level, hepatic steatosis In CT. lfts in am  Morbid obesity with BMI 35.8-will need dietary counseling.  Nutrition:NPO  Critical care was discussed multiple times today and has advised to keep him in the stepdown unit for now, but have low threshold for ICU transfer in case of any hemodynamic instability.  Patient is at risk of decompensation. I have seen him In ED multiple times.  Addendum 7:05pm: He was found on the floor-but no head or obvious injury. will order CT head since he has confusion too intermittently. Discussed with RN this evening- labs reviewed. Lbs shows TG at 1136,Wonder if he has  HTGP,he remains on Insulin drip-may need apheresis if not improving,his calcium is stable at 9.0 but with AKI. BMP shows worsening renal failure, lactic acid worsened to 5.0.-icu team will address apheresis if needed and they want to cont on aggressive ivf. I again called and discussed with PCCM Dr Marchelle Gearing to transfer to Critical care service due to his worsening status- advised repeat abg, lactic acid  ivf 500 bolus,CK, changing Zosyn to meropenem which I ordered. IV team attempting median line. Ordered foley for UOP measurement. Signed out to Night NP. He may need central line  Or IO if unable to obtain line or if hypotensive. Again spoke with Night NP who had discussed with ICU team Dr Violet Baldy and Dr Marchelle Gearing and he will be transferred to ICU from ED. He is at risk of acute decompensation.  I called his contact-his mother no at [phone] multiple times- to update on his serious/critical situation-it goes to  voice mail (as Mardi Mainland).  Severity of Illness: The appropriate patient status for this patient is INPATIENT. Inpatient status is judged to be reasonable and necessary in order to provide the required intensity of service to ensure the patient's safety. The patient's presenting symptoms, physical exam findings, and initial radiographic and laboratory data in the context of their chronic comorbidities is felt to place them at high risk for further clinical deterioration. Furthermore, it is not anticipated that the patient will be medically stable for discharge from the hospital within 2 midnights of admission. The following factors support the patient status of inpatient due to DKA with  renal failure, new onset diabetes mellitus.  DVT prophylaxis: Lovenox Code Status: Full code Family Communication: Admission, patients condition and plan of care including tests being ordered have been discussed with the patien who indicate understanding and agree with the plan and Code Status.  Consults called:   Lanae Boast MD [facility]  If 7PM-7AM, please contact  night-coverage [email or web address]  20-Dec-2019, 11:51 AM

## 2019-12-01 NOTE — Progress Notes (Signed)
Pharmacy Antibiotic Note  Edward Woods is a 29 y.o. male admitted on 12/24/2019 with DKA (new-onset DM) w/ anion gap acidosis and pancreatitis.   Pharmacy has been consulted for Zosyn dosing for empiric antibiotic coverage given peripancreatic fluid collection.  Plan: Zosyn 3.375g IV q8h (4 hour infusion).  Height: 6' (182.9 cm) Weight: 120 kg (264 lb 8.8 oz) IBW/kg (Calculated) : 77.6  Temp (24hrs), Avg:98.7 F (37.1 C), Min:98.7 F (37.1 C), Max:98.7 F (37.1 C)  Recent Labs  Lab 12-24-19 0641 December 24, 2019 0733 2019/12/24 0750 December 24, 2019 1017 December 24, 2019 1156  WBC 20.5*  --   --   --  18.9*  CREATININE 2.78*  --  3.11* 2.46* 2.33*  LATICACIDVEN  --  3.9*  --  3.6* 3.2*    Estimated Creatinine Clearance: 63.7 mL/min (A) (by C-G formula based on SCr of 2.33 mg/dL (H)).    No Known Allergies  Antimicrobials this admission: 5/13 Zosyn >>   Dose adjustments this admission:  Microbiology results: BCx: sent  Thank you for allowing pharmacy to be a part of this patient's care.  Luisa Hart D 12/24/19 4:05 PM

## 2019-12-01 NOTE — ED Notes (Signed)
Patient found on the floor by staff. Assisted back to the bed. Patient oriented to his baseline, no injury noted, denies hitting head. Hospitalist paged by [employer] operational officer. Hospitalist recommends PICC line placement by IV team and fall precautions.

## 2019-12-01 NOTE — ED Triage Notes (Signed)
Pt from home called EMS d/t feelings of generalized weakness and malaise. Unable to keep food down

## 2019-12-01 NOTE — ED Notes (Signed)
2 unsuccessful IV attempts.

## 2019-12-01 NOTE — ED Notes (Signed)
Date and time results received: Dec 17, 2019 8:15 AM  (use smartphrase ".now" to insert current time)  Test: lactic 3.9, venous ph 7.093, potassium 6.5, glucose 908-cmp, glucose 926-bmet Critical Value:   Name of Provider Notified:[place] RN   Orders Received? Or Actions Taken?:

## 2019-12-01 NOTE — ED Notes (Signed)
Called and informed that a PICC line would not be able to be placed until tomorrow.

## 2019-12-01 NOTE — ED Provider Notes (Addendum)
7:47 AM signout from [employer] at shift change.  Patient here with general malaise, reports one episode of vomiting today.  Denies any past medical problems.  States that he had diabetes "in middle school" but has not been on any treatments for this.  Patient with elevated white blood cell count, 80 ketones in the urine with greater than 500 glucose.  CBG checked and is greater than 600.  D-dimer noted to be mildly elevated however patient does not have any significant chest pain.  Given other unifying diagnosis, do not feel that evaluation for PE is warranted at this time unless symptoms or persistence with treatment.  COVID PCR neg, CXR neg.   Patient is tachycardic and tachypneic without hypoxia.  Lungs are clear to auscultation bilaterally.  He has extremely dry mucous membranes.  He has general, nonfocal, mild abdominal tenderness.  Lungs are clear to auscultation bilaterally.  He continues to have good urinary output.  Clinically signs and symptoms are consistent with DKA.    DKA order set initiated.  Patient be given 2 L lactated Ringer's.  Will obtain VBG, Beta-hydroxybutyrate to confirm diagnosis.  Will need admission to the hospital.  8:20 AM patient with pH of 7.1, bicarb 9, lipase in 300s, blood sugar 900s.  Potassium is elevated into the sixes, however with mild hemolysis.  ED without changes of hyperkalemia.  Nurses establishing second IV, will initiate insulin therapy.  8:47 AM Discussed patient personally with Dr. Dayna Barker who will see.   9:07 AM Spoke with Dr. Dayna Barker -- would like 3L NS instead of LR. Also requests 10U insulin bolus prior to drip. RN aware.   9:30 AM Pt rechecked. Currently receiving 3L NS through 2 peripheral IVs. He is rousable. He is requesting some liquid for his throat.   Will check lactate and VBG at completion of 3L fluid bolus and give another liter.   11:57 AM patient hydrated 4 L.  Discussed repeat labs with Dr. Dayna Barker.  I also spoke with Dr. Marchelle Gearing of  critical care.  He is comfortable with patient being admitted on the hospitalist service.  PCCM to consult on patient and be aware in case he decompensates.   Patient rechecked multiple additional times.  He is clinically unchanged.  BP (!) 166/49   Pulse (!) 108   Temp 98.7 F (37.1 C) (Oral)   Resp (!) 32   Ht 6' (1.829 m)   Wt 120 kg   SpO2 90%   BMI 35.88 kg/m   1:54 PM Spoke with Dr. Marchelle Gearing who has seen. Encouraged continued hydration.   CRITICAL CARE Performed by: Renne Crigler PA-C Total critical care time: 75 minutes Critical care time was exclusive of separately billable procedures and treating other patients. Critical care was necessary to treat or prevent imminent or life-threatening deterioration. Critical care was time spent personally by me on the following activities: development of treatment plan with patient and/or surrogate as well as nursing, discussions with consultants, evaluation of patient's response to treatment, examination of patient, obtaining history from patient or surrogate, ordering and performing treatments and interventions, ordering and review of laboratory studies, ordering and review of radiographic studies, pulse oximetry and re-evaluation of patient's condition.  Results for orders placed or performed during the hospital encounter of 12-13-19  SARS Coronavirus 2 by RT PCR (hospital order, performed in [facility] hospital lab) Nasopharyngeal Nasopharyngeal Swab   Specimen: Nasopharyngeal Swab  Result Value Ref Range   SARS Coronavirus 2 NEGATIVE NEGATIVE  Comprehensive metabolic  panel  Result Value Ref Range   Sodium 140 135 - 145 mmol/L   Potassium 6.5 (HH) 3.5 - 5.1 mmol/L   Chloride 101 98 - 111 mmol/L   CO2 9 (L) 22 - 32 mmol/L   Glucose, Bld 908 (HH) 70 - 99 mg/dL   BUN 32 (H) 6 - 20 mg/dL   Creatinine, Ser 4.28 (H) 0.61 - 1.24 mg/dL   Calcium 76.8 8.9 - 11.5 mg/dL   Total Protein 9.7 (H) 6.5 - 8.1 g/dL   Albumin 4.8 3.5 - 5.0 g/dL    AST 28 15 - 41 U/L   ALT 50 (H) 0 - 44 U/L   Alkaline Phosphatase 97 38 - 126 U/L   Total Bilirubin 2.4 (H) 0.3 - 1.2 mg/dL   GFR calc non Af Amer 30 (L) >60 mL/min   GFR calc Af Amer 35 (L) >60 mL/min   Anion gap 30 (H) 5 - 15  Lipase, blood  Result Value Ref Range   Lipase 342 (H) 11 - 51 U/L  CBC with Differential  Result Value Ref Range   WBC 20.5 (H) 4.0 - 10.5 K/uL   RBC 6.62 (H) 4.22 - 5.81 MIL/uL   Hemoglobin 18.6 (H) 13.0 - 17.0 g/dL   HCT 72.6 (H) 20.3 - 55.9 %   MCV 86.7 80.0 - 100.0 fL   MCH 28.1 26.0 - 34.0 pg   MCHC 32.4 30.0 - 36.0 g/dL   RDW 74.1 63.8 - 45.3 %   Platelets 540 (H) 150 - 400 K/uL   nRBC 0.0 0.0 - 0.2 %   Neutrophils Relative % 81 %   Neutro Abs 16.7 (H) 1.7 - 7.7 K/uL   Lymphocytes Relative 10 %   Lymphs Abs 2.1 0.7 - 4.0 K/uL   Monocytes Relative 8 %   Monocytes Absolute 1.5 (H) 0.1 - 1.0 K/uL   Eosinophils Relative 0 %   Eosinophils Absolute 0.0 0.0 - 0.5 K/uL   Basophils Relative 0 %   Basophils Absolute 0.1 0.0 - 0.1 K/uL   Immature Granulocytes 1 %   Abs Immature Granulocytes 0.12 (H) 0.00 - 0.07 K/uL  Urinalysis, Routine w reflex microscopic  Result Value Ref Range   Color, Urine AMBER (A) YELLOW   APPearance CLEAR CLEAR   Specific Gravity, Urine 1.033 (H) 1.005 - 1.030   pH 6.0 5.0 - 8.0   Glucose, UA >=500 (A) NEGATIVE mg/dL   Hgb urine dipstick NEGATIVE NEGATIVE   Bilirubin Urine NEGATIVE NEGATIVE   Ketones, ur 80 (A) NEGATIVE mg/dL   Protein, ur >=646 (A) NEGATIVE mg/dL   Nitrite NEGATIVE NEGATIVE   Leukocytes,Ua NEGATIVE NEGATIVE   RBC / HPF 0-5 0 - 5 RBC/hpf   WBC, UA 0-5 0 - 5 WBC/hpf   Bacteria, UA NONE SEEN NONE SEEN   Mucus PRESENT   D-dimer, quantitative  Result Value Ref Range   D-Dimer, Quant 0.62 (H) 0.00 - 0.50 ug/mL-FEU  Lactic acid, plasma  Result Value Ref Range   Lactic Acid, Venous 3.9 (HH) 0.5 - 1.9 mmol/L  Basic metabolic panel  Result Value Ref Range   Sodium 140 135 - 145 mmol/L   Potassium 6.0  (H) 3.5 - 5.1 mmol/L   Chloride 99 98 - 111 mmol/L   CO2 10 (L) 22 - 32 mmol/L   Glucose, Bld 926 (HH) 70 - 99 mg/dL   BUN 31 (H) 6 - 20 mg/dL   Creatinine, Ser 8.03 (H) 0.61 - 1.24 mg/dL  Calcium 10.2 8.9 - 10.3 mg/dL   GFR calc non Af Amer 26 (L) >60 mL/min   GFR calc Af Amer 30 (L) >60 mL/min   Anion gap 31 (H) 5 - 15  Beta-hydroxybutyric acid  Result Value Ref Range   Beta-Hydroxybutyric Acid >8.00 (H) 0.05 - 0.27 mmol/L  Blood gas, venous  Result Value Ref Range   FIO2 21.00    pH, Ven 7.093 (LL) 7.250 - 7.430   pCO2, Ven 35.6 (L) 44.0 - 60.0 mmHg   pO2, Ven 46.3 (H) 32.0 - 45.0 mmHg   Bicarbonate 10.4 (L) 20.0 - 28.0 mmol/L   Acid-base deficit 20.1 (H) 0.0 - 2.0 mmol/L   O2 Saturation 69.1 %   Patient temperature 98.7   Blood gas, venous (at [facility] and [facility], not at [facility])  Result Value Ref Range   FIO2 21.00    pH, Ven 7.092 (LL) 7.250 - 7.430   pCO2, Ven 29.4 (L) 44.0 - 60.0 mmHg   pO2, Ven 56.5 (H) 32.0 - 45.0 mmHg   Bicarbonate 8.5 (L) 20.0 - 28.0 mmol/L   Acid-base deficit 21.3 (H) 0.0 - 2.0 mmol/L   O2 Saturation 76.3 %   Patient temperature 98.6   Lactic acid, plasma  Result Value Ref Range   Lactic Acid, Venous 3.6 (HH) 0.5 - 1.9 mmol/L  Basic metabolic panel  Result Value Ref Range   Sodium 146 (H) 135 - 145 mmol/L   Potassium 4.5 3.5 - 5.1 mmol/L   Chloride 110 98 - 111 mmol/L   CO2 9 (L) 22 - 32 mmol/L   Glucose, Bld 736 (HH) 70 - 99 mg/dL   BUN 33 (H) 6 - 20 mg/dL   Creatinine, Ser 6.97 (H) 0.61 - 1.24 mg/dL   Calcium 8.7 (L) 8.9 - 10.3 mg/dL   GFR calc non Af Amer 35 (L) >60 mL/min   GFR calc Af Amer 40 (L) >60 mL/min   Anion gap 27 (H) 5 - 15  CBG monitoring, ED  Result Value Ref Range   Glucose-Capillary >600 (HH) 70 - 99 mg/dL  CBG monitoring, ED  Result Value Ref Range   Glucose-Capillary >600 (HH) 70 - 99 mg/dL  CBG monitoring, ED  Result Value Ref Range   Glucose-Capillary >600 (HH) 70 - 99 mg/dL  CBG monitoring, ED  Result Value Ref Range    Glucose-Capillary >600 (HH) 70 - 99 mg/dL  CBG monitoring, ED  Result Value Ref Range   Glucose-Capillary >600 (HH) 70 - 99 mg/dL  CBG monitoring, ED  Result Value Ref Range   Glucose-Capillary 548 (HH) 70 - 99 mg/dL   Comment 1 Notify RN   CBG monitoring, ED  Result Value Ref Range   Glucose-Capillary 546 (HH) 70 - 99 mg/dL   Comment 1 Notify RN   Troponin I (High Sensitivity)  Result Value Ref Range   Troponin I (High Sensitivity) 4 <18 ng/L  Troponin I (High Sensitivity)  Result Value Ref Range   Troponin I (High Sensitivity) 4 <18 ng/L   DG Chest Port 1 View  Result Date: 12/16/2019 CLINICAL DATA:  Shortness of breath EXAM: PORTABLE CHEST 1 VIEW COMPARISON:  None. FINDINGS: Normal heart size and mediastinal contours. Low volume chest no acute infiltrate or edema. No effusion or pneumothorax. No acute osseous findings. IMPRESSION: Negative low volume chest Electronically Signed   By: Marnee Spring M.D.   On: 12/02/2019 06:27       Renne Crigler, PA-C 11/30/2019 1159  Carlisle Cater, PA-C 12-15-2019 [place], MD 12/15/2019 1513

## 2019-12-01 NOTE — Progress Notes (Addendum)
Inpatient Diabetes Program Recommendations  AACE/ADA: New Consensus Statement on Inpatient Glycemic Control (2015)  Target Ranges:  Prepandial:   less than 140 mg/dL      Peak postprandial:   less than 180 mg/dL (1-2 hours)      Critically ill patients:  140 - 180 mg/dL   Lab Results  Component Value Date   GLUCAP 412 (H) 11/28/2019    Review of Glycemic Control  Diabetes history: Type 2? Outpatient Diabetes medications: none Current orders for Inpatient glycemic control: IV insulin  Inpatient Diabetes Program Recommendations:   Received diabetes coordinator consult. Noted that patient was started on IV insulin Endotool in ED at 0827 with blood sugar of >600 mg/dl.  Noted per physician note that patient was not being treated for diabetes at home currently. Awaiting HgbA1C result. Will need to continue on IV insulin drip until labs meet criteria to discontinue drip and transition to SQ insulin.   Will need to determine basal/bolus dosages per drip rates and weight.  Preliminary basal dosing:( weight based) 120 kg X 0.15 units/kg=18 units. Titrate dosages as needed. Recommend adding Novolog correction scale and Novolog meal coverage when eating.   Will continue to monitor blood sugars while in the hospital.   Smith Mince RN BSN CDE Diabetes Coordinator Pager: [phone]  8am-5pm

## 2019-12-01 NOTE — ED Notes (Signed)
When walking into room 13 noticed that Pt had climbed out of bed and crawled to door to open door and lay down at doorway naked.

## 2019-12-01 NOTE — Consult Note (Signed)
NAME:  Edward Woods, MRN:  [id number], DOB:  19-Oct-1990, LOS: 0 ADMISSION DATE:  11/30/2019, CONSULTATION DATE:  5/13 REFERRING MD:  Lupita Leash, CHIEF COMPLAINT: Diabetic ketoacidosis with acute metabolic encephalopathy  Brief History   29 year old male presented with weakness, fatigue, nausea vomiting on 5/13.  Admitted with new diagnosis of diabetic ketoacidosis.  Had ongoing lactic acidosis, and in spite of IV fluid resuscitation.  Pulmonary asked to evaluate due to altered sensorium and persistent lactic acidosis  History of present illness   This is a 29 year old male who presented to the emergency room on 5/13 with chief complaint of weakness, fatigue and malaise. Also reporting worsening abdominal discomfort and approximately 2 to 3 days of progressive shortness of breath with diffuse chest discomfort.  He had had nausea vomiting, had not been able to keep any foods down.  Denied being around any sick exposures, no fever chills cough hemoptysis diarrhea or constipation.  In the emergency room he was found to be tachycardic, normotensive, tachypneic, serum bicarbonate 9, glucose 908, creatinine 2.78, anion gap 30, mild elevated ALT of 50, lipase 342, white blood cell count 20.5 with hemoglobin of 18.6, serum lactate of 3.9 beta hydroxybutyric acid was greater than 8.  He was to be admitted with a working diagnosis of acute diabetic ketoacidosis with resultant acute metabolic encephalopathy.  IV fluids were administered, he received 4 L of crystalloid resuscitation, IV insulin was started, lactic acid still remain elevated in spite of fluid resuscitation.  Because of profound metabolic derangements and altered sensorium critical care was asked to assist with care and evaluate.  Past Medical History   No known medical history Significant Hospital Events   5/13 admitted with working diagnosis of DKA, administered IV fluids, insulin started  Consults:  Critical care consulted 5/13  Procedures:     Significant Diagnostic Tests:  Beta hydroxybutyric acid was greater than 8 Lipase was 342   Micro Data:  BCX2 5/13>>>  Antimicrobials:    Interim history/subjective:  Awake, does not off quickly, but now oriented x3.  Nursing reports remarkably improved level of consciousness since admission  Objective   Blood pressure 100/65, pulse (Abnormal) 119, temperature 98.7 F (37.1 C), temperature source Oral, resp. rate (Abnormal) 28, height 6' (1.829 m), weight 120 kg, SpO2 96 %.        Intake/Output Summary (Last 24 hours) at 12/03/2019 1224 Last data filed at 12/16/2019 1155 Gross per 24 hour  Intake 4947.05 ml  Output no documentation  Net 4947.05 ml   Filed Weights   11/30/2019 0750  Weight: 120 kg    Examination: General: Obese 30 year old black male lying in bed he is in no acute distress currently, he will awaken to gentle verbal request HENT: Normocephalic atraumatic mucous membranes dry no JVD sclera nonicteric Lungs: Clear  no accessory use currently Cardiovascular: Remains tachycardic without murmur rub or gallop Abdomen: Soft, not tender to palpation present bowel sounds Extremities: Warm and dry Neuro: Oriented x3, does not off back to sleep quite quickly.  No focal motor deficits GU: Clear yellow urine  Resolved Hospital Problem list     Assessment & Plan:   DKA Plan Agree with continuing hyperglycemic emergency protocol, focusing on IV hydration, correction of hyperglycemia, and serial blood chemistries Check hemoglobin A1c Will benefit from diabetic coordinator consultation  Anion gap metabolic acidosis: mix of DKA, lactic acidosis and possibly AKI Plan Continue current hydration Continue IV insulin Repeat lactic acid Serial chemistries Serial beta hydroxybutyric acid's  Acute  metabolic encephalopathy  Slowly improving Plan Supportive care  Mild pancreatitis  Plan N.p.o. for now Repeat lipase a.m.  AKI Plan Renal dose medications IV  hydration Strict intake output Follow-up labs  Hyperkalemia  Plan This should correct as hyperglycemia resolves Close observation of chemistries as high likelihood that he will develop hypokalemia as acidosis improves     Best practice:  Diet: N.p.o. Pain/Anxiety/Delirium protocol (if indicated): Not indicated VAP protocol (if indicated): Not indicated DVT prophylaxis: Low molecular weight heparin GI prophylaxis: Not indicated Glucose control: Ordered Mobility: Bedrest for now Code Status: Full code Family Communication: Per primary Disposition:  Admit to stepdown Labs   CBC: Recent Labs  Lab Dec 11, 2019 0641 11-Dec-2019 1156  WBC 20.5* 18.9*  NEUTROABS 16.7*  --   HGB 18.6* 17.8*  HCT 57.4* 54.9*  MCV 86.7 86.9  PLT 540* 462*    Basic Metabolic Panel: Recent Labs  Lab Dec 11, 2019 0641 12-11-19 0750 Dec 11, 2019 1017  NA 140 140 146*  K 6.5* 6.0* 4.5  CL 101 99 110  CO2 9* 10* 9*  GLUCOSE 908* 926* 736*  BUN 32* 31* 33*  CREATININE 2.78* 3.11* 2.46*  CALCIUM 10.3 10.2 8.7*   GFR: Estimated Creatinine Clearance: 60.4 mL/min (A) (by C-G formula based on SCr of 2.46 mg/dL (H)). Recent Labs  Lab Dec 11, 2019 0641 12-11-2019 0733 11-Dec-2019 1017 12/11/2019 1156  WBC 20.5*  --   --  18.9*  LATICACIDVEN  --  3.9* 3.6*  --     Liver Function Tests: Recent Labs  Lab 12-11-2019 0641  AST 28  ALT 50*  ALKPHOS 97  BILITOT 2.4*  PROT 9.7*  ALBUMIN 4.8   Recent Labs  Lab Dec 11, 2019 0641  LIPASE 342*   No results for input(s): AMMONIA in the last 168 hours.  ABG    Component Value Date/Time   HCO3 8.5 (L) 12-11-2019 1017   ACIDBASEDEF 21.3 (H) 12/11/2019 1017   O2SAT 76.3 12-11-2019 1017     Coagulation Profile: No results for input(s): INR, PROTIME in the last 168 hours.  Cardiac Enzymes: No results for input(s): CKTOTAL, CKMB, CKMBINDEX, TROPONINI in the last 168 hours.  HbA1C: No results found for: HGBA1C  CBG: Recent Labs  Lab 12-11-2019 0959  Dec 11, 2019 1027 2019/12/11 1057 11-Dec-2019 1128 2019-12-11 1158  GLUCAP >600* >600* 548* 546* 459*    Review of Systems:   Not able   Past Medical History  He,  has no past medical history on file.   Surgical History   History reviewed. No pertinent surgical history.   Social History   reports that he has never smoked. He has never used smokeless tobacco. He reports that he does not drink alcohol or use drugs.   Family History   His family history is not on file.   Allergies No Known Allergies   Home Medications  Prior to Admission medications   Medication Sig Start Date End Date Taking? Authorizing Provider  silver sulfADIAZINE (SILVADENE) 1 % cream Apply 1 application topically daily. Patient not taking: Reported on 2019/12/11 05/06/16   Elvina Sidle, MD     Critical care time: NA

## 2019-12-01 NOTE — Progress Notes (Signed)
Per pt's RN Verl Blalock, patient now has central line access and he requested levophed to be changed to quad. Strength.  Dorna Leitz, PharmD, BCPS 11/20/2019 10:57 PM

## 2019-12-02 ENCOUNTER — Inpatient Hospital Stay (HOSPITAL_COMMUNITY): Payer: Medicaid Other

## 2019-12-02 DIAGNOSIS — N170 Acute kidney failure with tubular necrosis: Secondary | ICD-10-CM

## 2019-12-02 DIAGNOSIS — Z452 Encounter for adjustment and management of vascular access device: Secondary | ICD-10-CM

## 2019-12-02 DIAGNOSIS — R579 Shock, unspecified: Secondary | ICD-10-CM

## 2019-12-02 DIAGNOSIS — N179 Acute kidney failure, unspecified: Secondary | ICD-10-CM

## 2019-12-02 LAB — BASIC METABOLIC PANEL
Anion gap: 13 (ref 5–15)
Anion gap: 11 (ref 5–15)
Anion gap: 14 (ref 5–15)
Anion gap: 16 — ABNORMAL HIGH (ref 5–15)
BUN: 30 mg/dL — ABNORMAL HIGH (ref 6–20)
BUN: 42 mg/dL — ABNORMAL HIGH (ref 6–20)
BUN: 43 mg/dL — ABNORMAL HIGH (ref 6–20)
BUN: 42 mg/dL — ABNORMAL HIGH (ref 6–20)
BUN: 39 mg/dL — ABNORMAL HIGH (ref 6–20)
CO2: 13 mmol/L — ABNORMAL LOW (ref 22–32)
CO2: 17 mmol/L — ABNORMAL LOW (ref 22–32)
CO2: 19 mmol/L — ABNORMAL LOW (ref 22–32)
CO2: 18 mmol/L — ABNORMAL LOW (ref 22–32)
CO2: 18 mmol/L — ABNORMAL LOW (ref 22–32)
Calcium: 5.7 mg/dL — CL (ref 8.9–10.3)
Calcium: 8.5 mg/dL — ABNORMAL LOW (ref 8.9–10.3)
Calcium: 8.5 mg/dL — ABNORMAL LOW (ref 8.9–10.3)
Calcium: 7.9 mg/dL — ABNORMAL LOW (ref 8.9–10.3)
Calcium: 7.4 mg/dL — ABNORMAL LOW (ref 8.9–10.3)
Chloride: >130 mmol/L (ref 98–111)
Chloride: 123 mmol/L — ABNORMAL HIGH (ref 98–111)
Chloride: 127 mmol/L — ABNORMAL HIGH (ref 98–111)
Chloride: 121 mmol/L — ABNORMAL HIGH (ref 98–111)
Chloride: 114 mmol/L — ABNORMAL HIGH (ref 98–111)
Creatinine, Ser: 3.59 mg/dL — ABNORMAL HIGH (ref 0.61–1.24)
Creatinine, Ser: 5.65 mg/dL — ABNORMAL HIGH (ref 0.61–1.24)
Creatinine, Ser: 6.71 mg/dL — ABNORMAL HIGH (ref 0.61–1.24)
Creatinine, Ser: 6.21 mg/dL — ABNORMAL HIGH (ref 0.61–1.24)
Creatinine, Ser: 5.96 mg/dL — ABNORMAL HIGH (ref 0.61–1.24)
GFR calc Af Amer: 25 mL/min — ABNORMAL LOW (ref >60)
GFR calc Af Amer: 15 mL/min — ABNORMAL LOW (ref >60)
GFR calc Af Amer: 12 mL/min — ABNORMAL LOW (ref >60)
GFR calc Af Amer: 13 mL/min — ABNORMAL LOW (ref >60)
GFR calc Af Amer: 14 mL/min — ABNORMAL LOW (ref >60)
GFR calc non Af Amer: 22 mL/min — ABNORMAL LOW (ref >60)
GFR calc non Af Amer: 13 mL/min — ABNORMAL LOW (ref >60)
GFR calc non Af Amer: 10 mL/min — ABNORMAL LOW (ref >60)
GFR calc non Af Amer: 11 mL/min — ABNORMAL LOW (ref >60)
GFR calc non Af Amer: 12 mL/min — ABNORMAL LOW (ref >60)
Glucose, Bld: 142 mg/dL — ABNORMAL HIGH (ref 70–99)
Glucose, Bld: 143 mg/dL — ABNORMAL HIGH (ref 70–99)
Glucose, Bld: 101 mg/dL — ABNORMAL HIGH (ref 70–99)
Glucose, Bld: 281 mg/dL — ABNORMAL HIGH (ref 70–99)
Glucose, Bld: 228 mg/dL — ABNORMAL HIGH (ref 70–99)
Potassium: 2.8 mmol/L — ABNORMAL LOW (ref 3.5–5.1)
Potassium: 4.3 mmol/L (ref 3.5–5.1)
Potassium: 4.1 mmol/L (ref 3.5–5.1)
Potassium: 4.7 mmol/L (ref 3.5–5.1)
Potassium: 4.4 mmol/L (ref 3.5–5.1)
Sodium: 153 mmol/L — ABNORMAL HIGH (ref 135–145)
Sodium: 153 mmol/L — ABNORMAL HIGH (ref 135–145)
Sodium: 157 mmol/L — ABNORMAL HIGH (ref 135–145)
Sodium: 153 mmol/L — ABNORMAL HIGH (ref 135–145)
Sodium: 148 mmol/L — ABNORMAL HIGH (ref 135–145)

## 2019-12-02 LAB — BLOOD GAS, VENOUS
Acid-base deficit: 9.7 mmol/L — ABNORMAL HIGH (ref 0.0–2.0)
Bicarbonate: 15.6 mmol/L — ABNORMAL LOW (ref 20.0–28.0)
FIO2: 21
O2 Saturation: 76.8 %
Patient temperature: 98.6
pCO2, Ven: 33.7 mmHg — ABNORMAL LOW (ref 44.0–60.0)
pH, Ven: 7.287 (ref 7.250–7.430)
pO2, Ven: 44 mmHg (ref 32.0–45.0)

## 2019-12-02 LAB — RENAL FUNCTION PANEL
Albumin: 3 g/dL — ABNORMAL LOW (ref 3.5–5.0)
Anion gap: 15 (ref 5–15)
BUN: 39 mg/dL — ABNORMAL HIGH (ref 6–20)
CO2: 19 mmol/L — ABNORMAL LOW (ref 22–32)
Calcium: 7.4 mg/dL — ABNORMAL LOW (ref 8.9–10.3)
Chloride: 114 mmol/L — ABNORMAL HIGH (ref 98–111)
Creatinine, Ser: 5.92 mg/dL — ABNORMAL HIGH (ref 0.61–1.24)
GFR calc Af Amer: 14 mL/min — ABNORMAL LOW (ref >60)
GFR calc non Af Amer: 12 mL/min — ABNORMAL LOW (ref >60)
Glucose, Bld: 228 mg/dL — ABNORMAL HIGH (ref 70–99)
Phosphorus: 1.6 mg/dL — ABNORMAL LOW (ref 2.5–4.6)
Potassium: 4.3 mmol/L (ref 3.5–5.1)
Sodium: 148 mmol/L — ABNORMAL HIGH (ref 135–145)

## 2019-12-02 LAB — GLUCOSE, CAPILLARY
Glucose-Capillary: 106 mg/dL — ABNORMAL HIGH (ref 70–99)
Glucose-Capillary: 106 mg/dL — ABNORMAL HIGH (ref 70–99)
Glucose-Capillary: 114 mg/dL — ABNORMAL HIGH (ref 70–99)
Glucose-Capillary: 118 mg/dL — ABNORMAL HIGH (ref 70–99)
Glucose-Capillary: 129 mg/dL — ABNORMAL HIGH (ref 70–99)
Glucose-Capillary: 137 mg/dL — ABNORMAL HIGH (ref 70–99)
Glucose-Capillary: 143 mg/dL — ABNORMAL HIGH (ref 70–99)
Glucose-Capillary: 147 mg/dL — ABNORMAL HIGH (ref 70–99)
Glucose-Capillary: 149 mg/dL — ABNORMAL HIGH (ref 70–99)
Glucose-Capillary: 151 mg/dL — ABNORMAL HIGH (ref 70–99)
Glucose-Capillary: 156 mg/dL — ABNORMAL HIGH (ref 70–99)
Glucose-Capillary: 165 mg/dL — ABNORMAL HIGH (ref 70–99)
Glucose-Capillary: 173 mg/dL — ABNORMAL HIGH (ref 70–99)
Glucose-Capillary: 179 mg/dL — ABNORMAL HIGH (ref 70–99)
Glucose-Capillary: 181 mg/dL — ABNORMAL HIGH (ref 70–99)
Glucose-Capillary: 188 mg/dL — ABNORMAL HIGH (ref 70–99)
Glucose-Capillary: 194 mg/dL — ABNORMAL HIGH (ref 70–99)
Glucose-Capillary: 213 mg/dL — ABNORMAL HIGH (ref 70–99)
Glucose-Capillary: 219 mg/dL — ABNORMAL HIGH (ref 70–99)
Glucose-Capillary: 267 mg/dL — ABNORMAL HIGH (ref 70–99)
Glucose-Capillary: 282 mg/dL — ABNORMAL HIGH (ref 70–99)
Glucose-Capillary: 95 mg/dL (ref 70–99)

## 2019-12-02 LAB — TRIGLYCERIDES: Triglycerides: 977 mg/dL — ABNORMAL HIGH (ref <150)

## 2019-12-02 LAB — CBC
HCT: 52.3 % — ABNORMAL HIGH (ref 39.0–52.0)
Hemoglobin: 17.4 g/dL — ABNORMAL HIGH (ref 13.0–17.0)
MCH: 28.2 pg (ref 26.0–34.0)
MCHC: 33.3 g/dL (ref 30.0–36.0)
MCV: 84.6 fL (ref 80.0–100.0)
Platelets: 275 10*3/uL (ref 150–400)
RBC: 6.18 MIL/uL — ABNORMAL HIGH (ref 4.22–5.81)
RDW: 15.1 % (ref 11.5–15.5)
WBC: 14.7 10*3/uL — ABNORMAL HIGH (ref 4.0–10.5)
nRBC: 0 % (ref 0.0–0.2)

## 2019-12-02 LAB — CORTISOL: Cortisol, Plasma: 88.2 ug/dL

## 2019-12-02 LAB — BETA-HYDROXYBUTYRIC ACID: Beta-Hydroxybutyric Acid: 0.96 mmol/L — ABNORMAL HIGH (ref 0.05–0.27)

## 2019-12-02 LAB — LACTIC ACID, PLASMA: Lactic Acid, Venous: 4.4 mmol/L (ref 0.5–1.9)

## 2019-12-02 MED ORDER — HYDROMORPHONE HCL 1 MG/ML IJ SOLN
INTRAMUSCULAR | Status: AC
  Administered 2019-12-02: 1 mg via INTRAVENOUS
  Filled 2019-12-02: qty 1

## 2019-12-02 MED ORDER — LACTATED RINGERS IV SOLN: INTRAVENOUS | Status: DC

## 2019-12-02 MED ORDER — HYDROMORPHONE HCL 1 MG/ML IJ SOLN: 1.0000 mg | INTRAMUSCULAR | Status: DC | PRN

## 2019-12-02 MED ORDER — INSULIN (MYXREDLIN) INFUSION FOR HYPERTRIGLYCERIDEMIA: 10.0000 [IU]/h | INTRAVENOUS | Status: DC

## 2019-12-02 MED ORDER — ACETAMINOPHEN 325 MG PO TABS
650.0000 mg | ORAL_TABLET | ORAL | Status: DC | PRN
  Filled 2019-12-02: qty 2

## 2019-12-02 MED ORDER — SODIUM BICARBONATE-DEXTROSE 150-5 MEQ/L-% IV SOLN: 150.0000 meq | INTRAVENOUS | Status: DC

## 2019-12-02 MED ORDER — PRISMASOL BGK 4/2.5 32-4-2.5 MEQ/L REPLACEMENT SOLN: Status: DC

## 2019-12-02 MED ORDER — INSULIN REGULAR(HUMAN) IN NACL 100-0.9 UT/100ML-% IV SOLN
INTRAVENOUS | Status: DC
  Administered 2019-12-02: 4 [IU]/h via INTRAVENOUS
  Administered 2019-12-03: 5.5 [IU]/h via INTRAVENOUS
  Administered 2019-12-04: 13 [IU]/h via INTRAVENOUS
  Administered 2019-12-05: 20 [IU]/h via INTRAVENOUS
  Administered 2019-12-05: 10 [IU]/h via INTRAVENOUS
  Administered 2019-12-05 (×3): 30 [IU]/h via INTRAVENOUS
  Administered 2019-12-06: 3.2 [IU]/h via INTRAVENOUS
  Filled 2019-12-02 (×9): qty 100

## 2019-12-02 MED ORDER — SODIUM CHLORIDE 0.9 % IV SOLN: INTRAVENOUS | Status: DC

## 2019-12-02 MED ORDER — HEPARIN SODIUM (PORCINE) 1000 UNIT/ML DIALYSIS
1000.0000 [IU] | INTRAMUSCULAR | Status: DC | PRN
  Administered 2019-12-06: 2400 [IU] via INTRAVENOUS_CENTRAL
  Administered 2019-12-08: 3000 [IU] via INTRAVENOUS_CENTRAL
  Filled 2019-12-02 (×3): qty 6

## 2019-12-02 MED ORDER — HEPARIN SODIUM (PORCINE) 5000 UNIT/ML IJ SOLN
5000.0000 [IU] | Freq: Three times a day (TID) | INTRAMUSCULAR | Status: DC
  Administered 2019-12-02 – 2019-12-05 (×9): 5000 [IU] via SUBCUTANEOUS
  Filled 2019-12-02 (×9): qty 1

## 2019-12-02 MED ORDER — HYDROMORPHONE HCL 1 MG/ML IJ SOLN: 1.0000 mg | Freq: Once | INTRAMUSCULAR | Status: AC

## 2019-12-02 MED ORDER — SODIUM BICARBONATE 8.4 % IV SOLN
INTRAVENOUS | Status: AC
  Administered 2019-12-02: 50 meq
  Filled 2019-12-02: qty 50

## 2019-12-02 MED ORDER — DEXTROSE-NACL 5-0.45 % IV SOLN: INTRAVENOUS | Status: DC

## 2019-12-02 MED ORDER — ORAL CARE MOUTH RINSE
15.0000 mL | Freq: Two times a day (BID) | OROMUCOSAL | Status: DC
  Administered 2019-12-02 – 2019-12-05 (×8): 15 mL via OROMUCOSAL

## 2019-12-02 MED ORDER — ACETAMINOPHEN 650 MG RE SUPP
650.0000 mg | RECTAL | Status: DC | PRN
  Administered 2019-12-02: 650 mg via RECTAL
  Filled 2019-12-02: qty 1

## 2019-12-02 MED ORDER — CHLORHEXIDINE GLUCONATE CLOTH 2 % EX PADS
6.0000 | MEDICATED_PAD | Freq: Every day | CUTANEOUS | Status: DC
  Administered 2019-12-02 – 2019-12-07 (×7): 6 via TOPICAL

## 2019-12-02 MED ORDER — SODIUM BICARBONATE-DEXTROSE 150-5 MEQ/L-% IV SOLN
150.0000 meq | INTRAVENOUS | Status: DC
  Administered 2019-12-02: 150 meq via INTRAVENOUS
  Filled 2019-12-02: qty 1000

## 2019-12-02 MED ORDER — PHENYLEPHRINE HCL-NACL 10-0.9 MG/250ML-% IV SOLN
0.0000 ug/min | INTRAVENOUS | Status: DC
  Administered 2019-12-02: 40 ug/min via INTRAVENOUS
  Administered 2019-12-02: 20 ug/min via INTRAVENOUS
  Administered 2019-12-04: 90 ug/min via INTRAVENOUS
  Administered 2019-12-04: 20 ug/min via INTRAVENOUS
  Administered 2019-12-04: 400 ug/min via INTRAVENOUS
  Filled 2019-12-02 (×3): qty 250
  Filled 2019-12-02: qty 500
  Filled 2019-12-02: qty 250

## 2019-12-02 MED ORDER — PRISMASOL BGK 4/2.5 32-4-2.5 MEQ/L IV SOLN: INTRAVENOUS | Status: DC

## 2019-12-02 MED ORDER — SODIUM CHLORIDE 0.9 % IV SOLN
1.0000 g | Freq: Three times a day (TID) | INTRAVENOUS | Status: DC
  Administered 2019-12-02 – 2019-12-08 (×17): 1 g via INTRAVENOUS
  Filled 2019-12-02 (×18): qty 1

## 2019-12-02 MED ORDER — DEXTROSE 50 % IV SOLN: 0.0000 mL | INTRAVENOUS | Status: DC | PRN

## 2019-12-02 NOTE — Procedures (Signed)
Arterial Catheter Insertion Procedure Note Edward Woods [id number] 1991-02-07  Procedure: Insertion of Arterial Catheter  Indications: Blood pressure monitoring and Frequent blood sampling  Procedure Details Consent: Risks of procedure as well as the alternatives and risks of each were explained to the (patient/caregiver).  Consent for procedure obtained. Time Out: Verified patient identification, verified procedure, site/side was marked, verified correct patient position, special equipment/implants available, medications/allergies/relevent history reviewed, required imaging and test results available.  Performed  Maximum sterile technique was used including antiseptics, cap, gloves, hand hygiene and mask. Skin prep: Chlorhexidine; local anesthetic administered 20 gauge catheter was inserted into left radial artery using the Seldinger technique. ULTRASOUND GUIDANCE USED: YES Evaluation Blood flow good; BP tracing good. Complications: No apparent complications.   NOTE:  Initially, multiple attempts (x4) to insert A-line in right radial artery unsuccessful, then proceeded to left radial artery, successful with second attempt.   Edward Woods 12/02/2019

## 2019-12-02 NOTE — Procedures (Signed)
Attempted arterial access guided by ultrasound.  1) First attempt was the left femoral. I was able to get the vessel three separate times however I was unable to thread guidewire due to the depth of the puncture site to the artery itself. Each attempt resulted in the guide wire kinking and was unable to thread from there. I then prepped and assess the left brachial artery. I was again able to cannulate the vessel but visualized the artery vasospasm on two separate occasions to point it was no longer visible and at each attempt was unable to thread guidewire.   I aborted the procedure at this time. For now we will try to get along with venous arterial attempts   Simonne Martinet ACNP-BC [facility] Pulmonary/Critical Care Pager # [phone] OR # [phone] if no answer

## 2019-12-02 NOTE — Progress Notes (Signed)
Discussed with Elink MD about inconsistency in BP readings and inaccuracy : repeated attempted at obtaining aline without success : elink aware . No new orders : I informed them unable to titrate drips based on BP inaccuracies

## 2019-12-02 NOTE — Progress Notes (Signed)
CRITICAL VALUE ALERT  Critical Value:  Chloride- >130   Calcium 5.7   Date & Time Notied:  12/02/2019 03:08 am  Provider Notified: E-link notified  Orders Received/Actions taken: No New Orders at current time.

## 2019-12-02 NOTE — Significant Event (Signed)
  Brief ICU Transfer Note  Request to evaluate patient with poor IV access, worsening hypotension and altered mental status.  Patient was admitted earlier with DKA, new onset diabetes, AKI, severe pancreatitis with hypertriglyceridemia.     Patient seen bedside in ED with SBPs in the 70s, lethargic and somnolent.  He has no IV access after infiltration, and IO had already been placed.   He is net + 7.1L, and < 1 liter urine output (per nursing staff, not well documented). He has been started on meropenem and levophed.   Central line placed, and A-line placed.  He was eventually started on a second pressor (vasopressin).  Over the course of the a couple of hours with resuscitation, his mentation improved, and though still moderately lethargic, was awake, conversational and oriented x4. No airway compromise.   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  He is at high risk for third spacing, pulmonary edema/potential ARDS and other common complications associated with severe pancreatitis.    HEENT:  Very dry MM Lungs:  Mild bibasilar crackles, nonlabored Abd: obese, significantly tender in the mid epigastric, no involuntary guarding or rebound, +decreased BS Ext: no c/c/e  Assessment: 1. DKA, new onset diabetes 2. Oliguric AKI 3. Severe pancreatitis 4. Distributive and hypovolemic shock due to #1 and 3 5. Hypertriglyceridemia 6. Hypernatremia due to profound dehydration 7. AGMA, lactic acidosis 8. Acute encephalopathy  Plan: 1. Vasopressors to keep MAP > 65 2. Agree with meropenem 3. Continue insulin drip, low continuous dose to avoid hypoglycemia-- should help with both DKA and triglyceride  4. Trend lactic acid 5. Blood cultures 6. Continue judicious IV fluid support -- on LR 100cc/hr and D51/2NS 50cc/hr (some fluids in pressors as well) 7. Renal consult in AM (reserved RIJ in case needs HD line) 8. Start bicarb drip 9. Strict I/Os 10. NPO  at this time given significant pain and shock 11. Pantoprazole IV 12. Needs to be seen by GI   Critical Care Time 55 minutes.  [name] [facility] Medicine

## 2019-12-02 NOTE — Progress Notes (Signed)
Noted that patient remains on IV insulin drip. Noted that drip rates have been 15-17 units/hr. Patient is resistant and critically ill due to pancreatitis, high triglycerides, and abnormal renal function.   Recommend keeping patient on IV insulin  for at least another 24 hours due to elevated triglycerides. Note that patient may have new onset diabetes, since there is no mention of diabetes in history. Will continue to monitor blood sugars and will follow up early next week with diabetes education when patient is more stable.   Smith Mince RN BSN CDE Diabetes Coordinator Pager: [phone]  8am-5pm

## 2019-12-02 NOTE — Procedures (Signed)
Central Venous Catheter Insertion Procedure Note OLUWATOBI VISSER [id number] Dec 06, 1990  Procedure: Insertion of Central Venous Catheter Indications: Assessment of intravascular volume, Drug and/or fluid administration and Frequent blood sampling  Procedure Details Consent: Risks of procedure as well as the alternatives and risks of each were explained to the (patient/caregiver).  Consent for procedure obtained. Time Out: Verified patient identification, verified procedure, site/side was marked, verified correct patient position, special equipment/implants available, medications/allergies/relevent history reviewed, required imaging and test results available.  Performed  Maximum sterile technique was used including antiseptics, cap, gloves, gown, hand hygiene and mask. Skin prep: Chlorhexidine; local anesthetic administered A antimicrobial bonded/coated triple lumen catheter was placed in the left internal jugular vein using the Seldinger technique.  Evaluation Blood flow good Complications: No apparent complications Patient did tolerate procedure well. Chest X-ray ordered to verify placement.  CXR: LIJ tip in SVC.  Jimi O Akingbade 12/02/2019, 3:45 AM

## 2019-12-02 NOTE — Consult Note (Addendum)
NAME:  Edward Woods, MRN:  [id number], DOB:  [birth year], LOS: 1 ADMISSION DATE:  Dec 24, 2019, CONSULTATION DATE:  5/13 REFERRING MD:  Edward Woods, CHIEF COMPLAINT: Diabetic ketoacidosis with acute metabolic encephalopathy  Brief History    29 year old male who presented to the emergency room on 5/13 with chief complaint of weakness, fatigue and malaise. Also reporting worsening abdominal discomfort and approximately 2 to 3 days of progressive shortness of breath with diffuse chest discomfort.  He had had nausea vomiting, had not been able to keep any foods down.  Denied being around any sick exposures, no fever chills cough hemoptysis diarrhea or constipation.  In the emergency room he was found to be tachycardic, normotensive, tachypneic, serum bicarbonate 9, glucose 908, creatinine 2.78, anion gap 30, mild elevated ALT of 50, lipase 342, white blood cell count 20.5 with hemoglobin of 18.6, serum lactate of 3.9 beta hydroxybutyric acid was greater than 8.  He was to be admitted with a working diagnosis of acute diabetic ketoacidosis with resultant acute metabolic encephalopathy.  IV fluids were administered, he received 4 L of crystalloid resuscitation, IV insulin was started, lactic acid still remain elevated in spite of fluid resuscitation.  Because of profound metabolic derangements and altered sensorium critical care was asked to assist with care and evaluate.  Past Medical History   No known medical history Significant Hospital Events   5/13 admitted with working diagnosis of DKA, administered IV fluids, insulin started . Awake, does not off quickly, but now oriented x3.  Nursing reports remarkably improved level of consciousness since admission  Consults:  Critical care consulted 5/13  Procedures:    Significant Diagnostic Tests:  Beta hydroxybutyric acid was greater than 8 Lipase was 342   Micro Data:  BCX2 5/13>>>  Antimicrobials:   5/13 - Zosyn 5/14 -Merrem  Antibiotics Given  (last 72 hours)    Date/Time Action Medication Dose Rate   12/24/2019 1616 New Bag/Given   piperacillin-tazobactam (ZOSYN) IVPB 3.375 g 3.375 g 12.5 mL/hr   12/02/19 0215 New Bag/Given   meropenem (MERREM) 1 g in sodium chloride 0.9 % 100 mL IVPB 1 g 200 mL/hr       Interim history/subjective:    12/02/2019\ - > worsening renal failure. Persistent lactic acidosis. On neo, vasopressin, levophed, bic gtt. On Merrem. Awake and oriented. Nearly anuric\ . TGL 1130   Results for Edward, Woods (MRN [id number]) as of 12/02/2019 07:58  Ref. Range December 24, 2019 16:52 12/02/2019 03:24  Triglycerides Latest Ref Range: <150 mg/dL 7,062 (H) 376 (H)     Objective   Blood pressure (!) 142/83, pulse (!) 131, temperature (!) 103.3 F (39.6 C), resp. rate (!) 30, height 6' (1.829 m), weight 120 kg, SpO2 94 %.    Intake/Output Summary (Last 24 hours) at 12/02/2019 0747 Last data filed at 12/02/2019 0630 Gross per 24 hour  Intake 10251.92 ml  Output --  Net 10251.92 ml   Filed Weights   December 24, 2019 0750  Weight: 120 kg      General Appearance:  Looks criticall ill OBESE - + Head:  Normocephalic, without obvious abnormality, atraumatic Eyes:  PERRL - yes, conjunctiva/corneas - muddy     Ears:  Normal external ear canals, both ears Nose:  G tube - no but has  Throat:  ETT TUBE - no , OG tube - no Neck:  Supple,  No enlargement/tenderness/nodules Lungs: Clear to auscultation bilaterally, Mild tachypnea. Heart:  S1 and S2 normal, no murmur, CVP - no.  Pressors -  multiple + Abdomen:  Soft, no masses, no organomegaly Genitalia / Rectal:  Not done Extremities:  Extremities- intact Skin:  ntact in exposed areas . Sacral area - not examined Neurologic:  Sedation - none -> RASS - +1 . Moves all 4s - yes. CAM-ICU - neg . Orientation - x3+      Resolved Hospital Problem list     Assessment & Plan:  Acute Respiratory Distress  12/02/2019 - at risk for itnubation . Mostly metabolic (below)  drivien  Plan  - intubate if worsens; hopefully CRRT can save off intubation   DKA with lactic acidosis- HgbA1C 11.6 at admission. Triggered by Acute Pancreatitis due to Hypertriglyceridemia  12/02/2019 - acidosis mainly now from renal failure   Plan  - Change to IICU Hyperglyc\emia protocol phase 2   Acute metabolic encephalopathy   12/02/2019 - resolved but at risk for recurrence  Plan Supportive care - address mentabolic issues  Pancreatitis - due to High TGL   Plan N.p.o. for now Repeat lipase a.m.\ Supportive care  AKI/ATN  12/02/2019 - worse. Meets CRRT critera  Plan Place HD  Start CRRT  Renal Consult - d.w Dr Hollie Salk   Circulatory shock - due to sepsis, acidosis, pancreatitis  12/02/2019  - on multiple pressors   Plan  - address with pressors  - MAP goal > 65   Best practice:  Diet: N.p.o. - might do TF or clear lquidis when better Pain/Anxiety/Delirium protocol (if indicated): Not indicated VAP protocol (if indicated): Not indicated DVT prophylaxis: Low molecular weight heparin GI prophylaxis: Not indicated Glucose control: Phase 2 Mobility: Bedrest for now Code Status: Full code  Family Communication: updated patient . Marland Kitchen App consnted for HD cath. Called Mom [phone] but went to some one called Edward Woods. The mom's name is Edward Woods though. Advised to just call ICU back and let us know if correct person. No PHI divulged on the call  =-> got correct number and called mom Edward Woods 6:59 PM an updated   Disposition: ICU    ATTESTATION & SIGNATURE   The patient Edward Woods is critically ill with multiple organ systems failure and requires high complexity decision making for assessment and support, frequent evaluation and titration of therapies, application of advanced monitoring technologies and extensive interpretation of multiple databases.   Critical Care Time devoted to patient care services described in this note is  45   Minutes. This time reflects time of care of this signee Dr Brand Males. This critical care time does not reflect procedure time, or teaching time or supervisory time of PA/NP/Med student/Med Resident etc but could involve care discussion time     Dr. Brand Males, M.D., [facility].C.P Pulmonary and Critical Care Medicine Staff Physician [facility] Pager: [phone], If no answer or between  15:00h - 7:00h: call [phone]  12/02/2019 7:48 AM     LABS    PULMONARY Recent Labs  Lab 12/19/2019 0750 12/07/2019 1017 12/12/2019 1942 11/26/2019 2148  PHART  --   --  7.258*  --   PCO2ART  --   --  28.7*  --   PO2ART  --   --  83.6  --   HCO3 10.4* 8.5* 12.4* 12.3*  O2SAT 69.1 76.3 95.3 70.8    CBC Recent Labs  Lab 11/26/2019 0641 12/06/2019 1156 12/02/19 0443  HGB 18.6* 17.8* 17.4*  HCT 57.4* 54.9* 52.3*  WBC 20.5*  18.9* 14.7*  PLT 540* 462* 275    COAGULATION No results for input(s): INR in the last 168 hours.  CARDIAC  No results for input(s): TROPONINI in the last 168 hours. No results for input(s): PROBNP in the last 168 hours.   CHEMISTRY Recent Labs  Lab 11/19/2019 1156 11/30/2019 1156 11/22/2019 1652 11/23/2019 1652 11/24/2019 2147 12/05/2019 2147 12/02/19 0202 12/02/19 0324  NA 150*  --  155*  --  151*  --  153* 153*  K 4.0   < > 3.9   < > 4.4   < > 2.8* 4.3  CL 115*  --  122*  --  120*  --  >130* 123*  CO2 11*  --  17*  --  13*  --  13* 17*  GLUCOSE 519*  --  290*  --  415*  --  142* 143*  BUN 31*  --  33*  --  39*  --  30* 42*  CREATININE 2.33*  --  3.36*  --  4.56*  --  3.59* 5.65*  CALCIUM 8.6*  --  9.0  --  8.7*  --  5.7* 8.5*  MG 3.1*  --   --   --  2.7*  --   --   --   PHOS 2.8  --   --   --  2.1*  --   --   --    < > = values in this interval not displayed.   Estimated Creatinine Clearance: 26.3 mL/min (A) (by C-G formula based on SCr of 5.65 mg/dL (H)).   LIVER Recent Labs  Lab 12/07/2019 0641  11/28/2019 2147  AST 28 36  ALT 50* 35  ALKPHOS 97 68  BILITOT 2.4* 1.4*  PROT 9.7* 7.2  ALBUMIN 4.8 3.5     INFECTIOUS Recent Labs  Lab 12/05/2019 1652 12/19/2019 2148 12/02/19 0202  LATICACIDVEN 5.0* 4.0* 4.4*     ENDOCRINE CBG (last 3)  Recent Labs    12/02/19 0521 12/02/19 0629 12/02/19 0738  GLUCAP 118* 106* 95         IMAGING x48h  - image(s) personally visualized  -   highlighted in bold CT ABDOMEN PELVIS WO CONTRAST  Result Date: 12/16/2019 CLINICAL DATA:  Abdominal pain EXAM: CT ABDOMEN AND PELVIS WITHOUT CONTRAST TECHNIQUE: Multidetector CT imaging of the abdomen and pelvis was performed following the standard protocol without IV contrast. COMPARISON:  None. FINDINGS: Lower chest: No acute abnormality. Hepatobiliary: Markedly decreased attenuation of the hepatic parenchyma compatible with hepatic steatosis. No for focal liver lesion. Gallbladder appears unremarkable. No hyperdense gallstone. No biliary dilatation. Pancreas: Diffuse pancreatic enlargement with extensive peripancreatic stranding and surrounding peripancreatic fluid. No organized fluid collection. No pancreatic ductal dilatation. Spleen: Normal in size without focal abnormality. Adrenals/Urinary Tract: Adrenal glands are unremarkable. Kidneys are normal, without renal calculi, focal lesion, or hydronephrosis. Bladder is unremarkable. Stomach/Bowel: Stomach is within normal limits. Appendix appears normal (series 2, image 74). No evidence of bowel wall thickening, distention, or inflammatory changes. Vascular/Lymphatic: No significant vascular findings are present. No enlarged abdominal or pelvic lymph nodes. Reproductive: Prostate is unremarkable. Other: No free fluid within the pelvis. No pneumoperitoneum. No abdominal wall hernia. Musculoskeletal: No acute or significant osseous findings. IMPRESSION: 1. Acute pancreatitis. No organized fluid collection. Please note that evaluation for parenchymal necrosis is  limited in the absence of intravenous contrast. 2. Hepatic steatosis. Electronically Signed   By: Duanne Guess D.O.   On: 12/19/2019 14:57   CT HEAD  WO CONTRAST  Result Date: 11/22/2019 CLINICAL DATA:  Generalized weakness and malaise EXAM: CT HEAD WITHOUT CONTRAST TECHNIQUE: Contiguous axial images were obtained from the base of the skull through the vertex without intravenous contrast. COMPARISON:  None. FINDINGS: Brain: Unenhanced images of the brain demonstrate subtle hypodensity within the left frontal periventricular white matter. I do not see any significant mass effect or sulcal effacement. No evidence of hemorrhage. Lateral ventricles and midline structures are otherwise unremarkable. There are no acute extra-axial fluid collections. Vascular: No hyperdense vessel or unexpected calcification. Skull: Normal. Negative for fracture or focal lesion. Sinuses/Orbits: No acute finding. Other: None. IMPRESSION: 1. Subtle hypodensity within the left frontal periventricular white matter, without associated mass effect. Findings could be related to chronic periventricular leukomalacia or chronic posttraumatic change. MRI with and without contrast may be useful for further characterization. 2. No acute hemorrhage. Electronically Signed   By: Sharlet Salina M.D.   On: 11/28/2019 19:40   DG Chest Portable 1 View  Result Date: 11/19/2019 CLINICAL DATA:  Central line placement EXAM: PORTABLE CHEST 1 VIEW COMPARISON:  Dec 01, 2019 FINDINGS: The heart size and mediastinal contours are within normal limits. Interval placement of a left-sided central venous catheter with the tip at the superior cavoatrial junction. There is again noted low lung volumes with subsegmental atelectasis. There is prominence of the central pulmonary vasculature. No acute osseous abnormality. IMPRESSION: Interval placement of left-sided central venous catheter with the tip at the superior cavoatrial junction. Overall shallow degree of  aeration. Electronically Signed   By: Jonna Clark M.D.   On: 11/30/2019 21:36   DG Chest Port 1 View  Result Date: 12/12/2019 CLINICAL DATA:  Shortness of breath EXAM: PORTABLE CHEST 1 VIEW COMPARISON:  None. FINDINGS: Normal heart size and mediastinal contours. Low volume chest no acute infiltrate or edema. No effusion or pneumothorax. No acute osseous findings. IMPRESSION: Negative low volume chest Electronically Signed   By: Marnee Spring M.D.   On: 12/02/2019 06:27   [place] EKG SITE RITE  Result Date: 11/20/2019 If Site Rite image not attached, placement could not be confirmed due to current cardiac rhythm.  US Abdomen Limited RUQ  Result Date: 12/06/2019 CLINICAL DATA:  Acute pancreatitis EXAM: ULTRASOUND ABDOMEN LIMITED RIGHT UPPER QUADRANT COMPARISON:  CT from earlier in the same day. FINDINGS: Gallbladder: Not well visualized due to overlying bowel gas. Common bile duct: Diameter: Well visualized. Liver: Diffusely increased in echogenicity consistent with fatty infiltration. Portal vein is patent on color Doppler imaging with normal direction of blood flow towards the liver. Other: None. IMPRESSION: Fatty liver. Significant overlying bowel gas limits the remainder of the exam. Electronically Signed   By: Alcide Clever M.D.   On: 12/13/2019 17:39

## 2019-12-02 NOTE — TOC Initial Note (Signed)
Transition of Care [facility]) - Initial/Assessment Note    Patient Details  Name: Edward Woods MRN: [id number] Date of Birth: 06-01-1991  [facility] of [facility]) CM/SW Contact:    Golda Acre, RN Phone Number: 12/02/2019, 10:04 AM  Clinical Narrative:                 Iv insulin, dka with symptoms,temp 104, CRRT on going, merrem and levophed./bun 432 creat 6.71, wbc 14.7, o2 at 4l/min via [place].   From home no pcp should return to home.  Expected Discharge Plan: Home/Self Care Barriers to Discharge: Continued Medical Work up   Patient Goals and CMS Choice Patient states their goals for this hospitalization and ongoing recovery are:: to go back home CMS Medicare.gov Compare Post Acute Care list provided to:: Patient    Expected Discharge Plan and Services Expected Discharge Plan: Home/Self Care       Living arrangements for the past 2 months: Single Family Home                                      Prior Living Arrangements/Services Living arrangements for the past 2 months: Single Family Home Lives with:: Relatives(lives with Mother) Patient language and need for interpreter reviewed:: No Do you feel safe going back to the place where you live?: Yes      Need for Family Participation in Patient Care: Yes (Comment) Care giver support system in place?: Yes (comment)   Criminal Activity/Legal Involvement Pertinent to Current Situation/Hospitalization: No - Comment as needed  Activities of Daily Living Home Assistive Devices/Equipment: None ADL Screening (condition at time of admission) Patient's cognitive ability adequate to safely complete daily activities?: No(in and out of alertness) Is the patient deaf or have difficulty hearing?: No Does the patient have difficulty seeing, even when wearing glasses/contacts?: No Does the patient have difficulty concentrating, remembering, or making decisions?: Yes Patient able to express need for assistance with ADLs?:  Yes Does the patient have difficulty dressing or bathing?: Yes Independently performs ADLs?: No Communication: Independent Dressing (OT): Needs assistance Is this a change from baseline?: Change from baseline, expected to last >3 days Grooming: Needs assistance Is this a change from baseline?: Change from baseline, expected to last >3 days Feeding: Needs assistance Is this a change from baseline?: Change from baseline, expected to last >3 days Bathing: Needs assistance Is this a change from baseline?: Change from baseline, expected to last >3 days Toileting: Needs assistance Is this a change from baseline?: Change from baseline, expected to last >3days In/Out Bed: Needs assistance Is this a change from baseline?: Change from baseline, expected to last >3 days Walks in Home: Needs assistance Is this a change from baseline?: Change from baseline, expected to last >3 days Does the patient have difficulty walking or climbing stairs?: Yes(secondary to weakness) Weakness of Legs: Both Weakness of Arms/Hands: None  Permission Sought/Granted                  Emotional Assessment Appearance:: Appears stated age     Orientation: : Oriented to Situation, Oriented to  Time, Oriented to Place, Oriented to Self Alcohol / Substance Use: Not Applicable Psych Involvement: No (comment)  Admission diagnosis:  Acute pancreatitis [K85.90] Hyperkalemia [E87.5] DKA (diabetic ketoacidoses) (HCC) [E11.10] Acute kidney injury (HCC) [N17.9] Elevated lipase [R74.8] Diabetic ketoacidosis without coma associated with type 2 diabetes mellitus (HCC) [E11.10] Patient Active Problem  List   Diagnosis Date Noted  . DKA (diabetic ketoacidoses) ([place]) 11/30/2019  . Acute encephalopathy   . OTHER MALAISE AND FATIGUE 12/21/2007  . PREDIABETES 12/21/2007  . ATTENTION DEFICIT, W/HYPERACTIVITY 09/17/2006   PCP:  Patient, No Pcp Per Pharmacy:   [id number] - [place], [place] [address] [place] [address] Phone: [phone] Fax: [phone]     Social Determinants of Health (SDOH) Interventions    Readmission Risk Interventions No flowsheet data found.

## 2019-12-02 NOTE — Progress Notes (Signed)
2145-patient admitted to ICU- Critical care MD with patient. A-Line placed at bedside in L radial- pulsatile blood flow noted on monitor and BP 103/57. MD notified at bedside of pH 7.185 and Bicarb of 12.3. MD informed RN to leave insulin gtt running at 15 units/hr.  67- RN notified E-Link of multiple issues including temperature elevated-101.1 F, labs, inulin gtt clarification and trouble with A-Line. Insulin drip to be changed in endotool from DKA to continuous rate. Currently running at 15 units/hr. CBGs remaining above 250 at this time.   0100- RN entered room for BP reading in 70s with dampened waveform. RN recalibrating A-Line and flushed per protocol. A-Line no longer showing pulsatile pressure and E-Link notified that a-line was lost. Access removed. Also notified of HR elevated to 150s.   0200-Foley placed- core temp reading 100.9 which elevated to 102.7. Order received for tylenol PO- patient unable to take pills at this time and rectal medication administered. Ice packs applied.   0230- Lab called with critical values- E-Link notified of labs and decision made to redraw labs at this time.   0345-cooling blanket applied. Current temperature 103.8. Cooling blanket, ice packs and rectal tylenol given at this time. E-Link notified.   0400- E-Link notified of low urine output-125cc- and CBG drop to 137. Notified of labs-creatinine 5.6. Orders received to decrease insulin gtt to 10 units/hour  0525-E-Link notified for CBG 118- Orders to decrease LR to 50 cc/hr and increase Bicarb gtt to 100cc/hr. Will recheck CBG at 0620.

## 2019-12-02 NOTE — Progress Notes (Signed)
CBG noted to be 106. RN notified E-Link of drop. No new orders at this time

## 2019-12-02 NOTE — Progress Notes (Signed)
eLink Physician-Brief Progress Note Patient Name: KORRY DALGLEISH DOB: [birth year] MRN: [id number]   Date of Service  12/02/2019  HPI/Events of Note  72 M history of un treated DM presented with generalized weakness and an episode of vomiting. He was in DKA with TG < 1,000. He was tachycardic and tachypneic, hypotensive despite 7 liters crystalloids now requiring levo, neo and vasopressin. CT abdomen with evidence of pancreatitis. He has been started on insulin 15 units drip and fluids.  eICU Interventions   VBG 7.09/29 will start D5 with 3 amps of bicarb. Continue hydration  Oligoanuric and glucose 130s, will decreased insulin drip given worsening renal function  Metabolic encephalopathy, CT head negative, reportedly improved and appears to be protecting airway  Fever on meropenem empirically, likely SIRS response from pancreatitis     Intervention Category Major Interventions: Hypovolemia - evaluation and treatment with fluids;Acid-Base disturbance - evaluation and management;Change in mental status - evaluation and management Evaluation Type: New Patient Evaluation  Darl Pikes 12/02/2019, 12:56 AM

## 2019-12-02 NOTE — Progress Notes (Signed)
[place] NOTE  Pharmacy Consult for Renal medication adjustment Indication: CRRT   No Known Allergies  Patient Measurements: Height: 6' (182.9 cm) Weight: 120 kg (264 lb 8.8 oz) IBW/kg (Calculated) : 77.6  Vital Signs: Temp: 101.5 F (38.6 C) (05/14 0900) Temp Source: Axillary (05/14 0000) BP: 133/51 (05/14 1030) Pulse Rate: 110 (05/14 1000) Intake/Output from previous day: 05/13 0701 - 05/14 0700 In: 10251.9 [I.V.:4549.8; IV Piggyback:5702.1] Out: -  Intake/Output from this shift: Total I/O In: 925.3 [I.V.:925.3] Out: -  Vent settings for last 24 hours:    Labs: Recent Labs    11/25/2019 0641 12/04/2019 0750 12/10/2019 1156 11/24/2019 1652 11/21/2019 2147 12/04/2019 2147 12/02/19 0202 12/02/19 0324 12/02/19 0443 12/02/19 0600  WBC 20.5*  --  18.9*  --   --   --   --   --  14.7*  --   HGB 18.6*  --  17.8*  --   --   --   --   --  17.4*  --   HCT 57.4*  --  54.9*  --   --   --   --   --  52.3*  --   PLT 540*  --  462*  --   --   --   --   --  275  --   CREATININE 2.78*   < > 2.33*   < > 4.56*   < > 3.59* 5.65*  --  6.71*  MG  --   --  3.1*  --  2.7*  --   --   --   --   --   PHOS  --   --  2.8  --  2.1*  --   --   --   --   --   ALBUMIN 4.8  --   --   --  3.5  --   --   --   --   --   PROT 9.7*  --   --   --  7.2  --   --   --   --   --   AST 28  --   --   --  36  --   --   --   --   --   ALT 50*  --   --   --  35  --   --   --   --   --   ALKPHOS 97  --   --   --  68  --   --   --   --   --   BILITOT 2.4*  --   --   --  1.4*  --   --   --   --   --    < > = values in this interval not displayed.   Estimated Creatinine Clearance: 22.1 mL/min (A) (by C-G formula based on SCr of 6.71 mg/dL (H)).  Recent Labs    12/02/19 0835 12/02/19 0933 12/02/19 1039  GLUCAP 106* 114* 129*    Microbiology: Recent Results (from the past 720 hour(s))  SARS Coronavirus 2 by RT PCR (hospital order, performed in [facility] hospital lab) Nasopharyngeal  Nasopharyngeal Swab     Status: None   Collection Time: 11/30/2019  6:41 AM   Specimen: Nasopharyngeal Swab  Result Value Ref Range Status   SARS Coronavirus 2 NEGATIVE NEGATIVE Final    Comment: (NOTE) SARS-CoV-2 target nucleic acids are NOT DETECTED. The SARS-CoV-2  RNA is generally detectable in upper and lower respiratory specimens during the acute phase of infection. The lowest concentration of SARS-CoV-2 viral copies this assay can detect is 250 copies / mL. A negative result does not preclude SARS-CoV-2 infection and should not be used as the sole basis for treatment or other patient management decisions.  A negative result may occur with improper specimen collection / handling, submission of specimen other than nasopharyngeal swab, presence of viral mutation(s) within the areas targeted by this assay, and inadequate number of viral copies (<250 copies / mL). A negative result must be combined with clinical observations, patient history, and epidemiological information. Fact Sheet for Patients:   BoilerBrush[employer]com.cy Fact Sheet for Healthcare Providers: https://pope.com/ This test is not yet approved or cleared  by the [place] FDA and has been authorized for detection and/or diagnosis of SARS-CoV-2 by FDA under an Emergency Use Authorization (EUA).  This EUA will remain in effect (meaning this test can be used) for the duration of the COVID-19 declaration under Section 564(b)(1) of the Act, 21 U.S.C. section 360bbb-3(b)(1), unless the authorization is terminated or revoked sooner. Performed at [facility], [address], [place], [place] [address]     Medications:  Scheduled:  . Chlorhexidine Gluconate Cloth  6 each Topical Daily  . heparin injection (subcutaneous)  5,000 Units Subcutaneous Q8H  . mouth rinse  15 mL Mouth Rinse BID  . pantoprazole (PROTONIX) IV  40 mg Intravenous Q24H   Infusions:  .   prismasol BGK 4/2.5 500 mL/hr at 12/02/19 1028  .  prismasol BGK 4/2.5 200 mL/hr at 12/02/19 1028  . sodium chloride Stopped (12/02/19 0143)  . sodium chloride    . dextrose 5 % and 0.45% NaCl 75 mL/hr at 12/02/19 0909  . insulin 9 Units/hr (12/02/19 1350)  . meropenem (MERREM) IV    . norepinephrine (LEVOPHED) Adult infusion 39 mcg/min (12/02/19 1423)  . phenylephrine (NEO-SYNEPHRINE) Adult infusion Stopped (12/02/19 0858)  . prismasol BGK 4/2.5 2,500 mL/hr at 12/02/19 1303  . vasopressin (PITRESSIN) infusion - *FOR SHOCK* 0.04 Units/min (12/02/19 1404)   PRN: acetaminophen, dextrose, heparin, ondansetron (ZOFRAN) IV  Assessment: DKA, acidosis > acute renal failure. Begin CRRT, avoid intubation  D2 abx Meropenem - for peripancreatic fluid collection per CT (though limited w/o contrast) (no necrosis on CT, consider stop abx) but Tmax 103.8 WBC 20 > 15, SCr 6.21  Plan:  Meropenem 1gm q12 > q8 for CRRT  Otho Bellows PharmD 12/02/2019,10:55 AM

## 2019-12-02 NOTE — Progress Notes (Signed)
B/P labile today, numerous attempts to wean Levo down and to keep CRRT 0-50 then even.  CCM was unable to place an Arterial line, ? Accuracy of cuff pressures .  CCM aware

## 2019-12-02 NOTE — Consult Note (Signed)
[facility]  HISTORY AND PHYSICAL  Edward Woods is an 29 y.o. male.    Chief Complaint: increased thirst and urination  HPI: Pt is a 79M with no sig PMH who is now seen in consultation at the request of Dr. Marchelle Gearing for eval and recs re: acute oliguric AKI and volume overload.  Pt presented with increased thirst and urination 5/13 and was found to have DKA and pancreatitis. Hydrated aggressively, insulin gtt.  He still was profoundly acidotic with elevated lactate. Became hypotensive, developed shock.  On 3 pressors and antibiotics.  TG > 1100.  No UOP.  In this setting we are asked to see.  At the time of my eval, pt somewhat obtunded, can't provide a lot of history.  Doesn't appear that he had a dx of DM prior to this.  No ingestions or NSAIDs.      PMH: History reviewed. No pertinent past medical history. PSH: History reviewed. No pertinent surgical history.   History reviewed. No pertinent past medical history.  Medications:   Scheduled: . Chlorhexidine Gluconate Cloth  6 each Topical Daily  . heparin injection (subcutaneous)  5,000 Units Subcutaneous Q8H  . mouth rinse  15 mL Mouth Rinse BID  . pantoprazole (PROTONIX) IV  40 mg Intravenous Q24H    Medications Prior to Admission  Medication Sig Dispense Refill  . silver sulfADIAZINE (SILVADENE) 1 % cream Apply 1 application topically daily. (Patient not taking: Reported on 12/20/19) 50 g 0    ALLERGIES:  No Known Allergies  FAM HX: History reviewed. No pertinent family history.  Social History:   reports that he has never smoked. He has never used smokeless tobacco. He reports that he does not drink alcohol or use drugs.  ROS: ROS: not obtainable d/t pt's LOC  Blood pressure (!) 95/35, pulse (!) 110, temperature 98.3 F (36.8 C), temperature source Axillary, resp. rate (!) 34, height 6' (1.829 m), weight 120 kg, SpO2 97 %. PHYSICAL EXAM: Physical Exam  GEN: appears ill, lying in bed HEENT  EOMI PERRL  NECK  No JVD PULM clear anteriorly CV tachycardic, no m/r/g ABD soft, diffusely tender EXT trace edema NEURO obtunded SKIN no rashes ACCESS: R IJ nontunneled HD cath   Results for orders placed or performed during the hospital encounter of Dec 20, 2019 (from the past 48 hour(s))  Urinalysis, Routine w reflex microscopic     Status: Abnormal   Collection Time: 20-Dec-2019  5:59 AM  Result Value Ref Range   Color, Urine AMBER (A) YELLOW    Comment: BIOCHEMICALS MAY BE AFFECTED BY COLOR   APPearance CLEAR CLEAR   Specific Gravity, Urine 1.033 (H) 1.005 - 1.030   pH 6.0 5.0 - 8.0   Glucose, UA >=500 (A) NEGATIVE mg/dL   Hgb urine dipstick NEGATIVE NEGATIVE   Bilirubin Urine NEGATIVE NEGATIVE   Ketones, ur 80 (A) NEGATIVE mg/dL   Protein, ur >=161 (A) NEGATIVE mg/dL   Nitrite NEGATIVE NEGATIVE   Leukocytes,Ua NEGATIVE NEGATIVE   RBC / HPF 0-5 0 - 5 RBC/hpf   WBC, UA 0-5 0 - 5 WBC/hpf   Bacteria, UA NONE SEEN NONE SEEN   Mucus PRESENT     Comment: Performed at [facility], [address] [place], [place] [address]  SARS Coronavirus 2 by RT PCR (hospital order, performed in [facility] hospital lab) Nasopharyngeal Nasopharyngeal Swab     Status: None   Collection Time: 2019-12-20  6:41 AM   Specimen: Nasopharyngeal Swab  Result Value Ref Range   SARS Coronavirus 2 NEGATIVE NEGATIVE    Comment: (NOTE) SARS-CoV-2 target nucleic acids are NOT DETECTED. The SARS-CoV-2 RNA is generally detectable in upper and lower respiratory specimens during the acute phase of infection. The lowest concentration of SARS-CoV-2 viral copies this assay can detect is 250 copies / mL. A negative result does not preclude SARS-CoV-2 infection and should not be used as the sole basis for treatment or other patient management decisions.  A negative result may occur with improper specimen collection / handling, submission of specimen other than nasopharyngeal swab, presence of viral  mutation(s) within the areas targeted by this assay, and inadequate number of viral copies (<250 copies / mL). A negative result must be combined with clinical observations, patient history, and epidemiological information. Fact Sheet for Patients:   BoilerBrush.com.cy Fact Sheet for Healthcare Providers: https://pope.com/ This test is not yet approved or cleared  by the [place] FDA and has been authorized for detection and/or diagnosis of SARS-CoV-2 by FDA under an Emergency Use Authorization (EUA).  This EUA will remain in effect (meaning this test can be used) for the duration of the COVID-19 declaration under Section 564(b)(1) of the Act, 21 U.S.C. section 360bbb-3(b)(1), unless the authorization is terminated or revoked sooner. Performed at [facility], [address] [place], [place] [address]   Comprehensive metabolic panel     Status: Abnormal   Collection Time: 12/07/2019  6:41 AM  Result Value Ref Range   Sodium 140 135 - 145 mmol/L    Comment: REPEATED TO VERIFY   Potassium 6.5 (HH) 3.5 - 5.1 mmol/L    Comment: REPEATED TO VERIFY SLIGHT HEMOLYSIS CRITICAL RESULT CALLED TO, READ BACK BY AND VERIFIED WITH: HALL,C. RN AT 3086 12/05/2019 MULLINS,T    Chloride 101 98 - 111 mmol/L    Comment: REPEATED TO VERIFY   CO2 9 (L) 22 - 32 mmol/L    Comment: REPEATED TO VERIFY   Glucose, Bld 908 (HH) 70 - 99 mg/dL    Comment: Glucose reference range applies only to samples taken after fasting for at least 8 hours. CRITICAL RESULT CALLED TO, READ BACK BY AND VERIFIED WITH: HALL,C. RN AT 5784 12/05/2019 MULLINS,T    BUN 32 (H) 6 - 20 mg/dL   Creatinine, Ser 6.96 (H) 0.61 - 1.24 mg/dL   Calcium 29.5 8.9 - 28.4 mg/dL   Total Protein 9.7 (H) 6.5 - 8.1 g/dL   Albumin 4.8 3.5 - 5.0 g/dL   AST 28 15 - 41 U/L   ALT 50 (H) 0 - 44 U/L   Alkaline Phosphatase 97 38 - 126 U/L   Total Bilirubin 2.4 (H) 0.3 - 1.2 mg/dL    GFR calc non Af Amer 30 (L) >60 mL/min   GFR calc Af Amer 35 (L) >60 mL/min   Anion gap 30 (H) 5 - 15    Comment: REPEATED TO VERIFY Performed at [facility], [address], [place], [place] [address]   Lipase, blood     Status: Abnormal   Collection Time: 11/19/2019  6:41 AM  Result Value Ref Range   Lipase 342 (H) 11 - 51 U/L    Comment: Performed at [facility], [address] [place], [place] [address]  CBC with Differential     Status: Abnormal   Collection Time: 12/15/2019  6:41 AM  Result Value Ref Range   WBC 20.5 (H) 4.0 - 10.5 K/uL   RBC 6.62 (  H) 4.22 - 5.81 MIL/uL   Hemoglobin 18.6 (H) 13.0 - 17.0 g/dL   HCT 09.8 (H) 11.9 - 14.7 %   MCV 86.7 80.0 - 100.0 fL   MCH 28.1 26.0 - 34.0 pg   MCHC 32.4 30.0 - 36.0 g/dL   RDW 82.9 56.2 - 13.0 %   Platelets 540 (H) 150 - 400 K/uL   nRBC 0.0 0.0 - 0.2 %   Neutrophils Relative % 81 %   Neutro Abs 16.7 (H) 1.7 - 7.7 K/uL   Lymphocytes Relative 10 %   Lymphs Abs 2.1 0.7 - 4.0 K/uL   Monocytes Relative 8 %   Monocytes Absolute 1.5 (H) 0.1 - 1.0 K/uL   Eosinophils Relative 0 %   Eosinophils Absolute 0.0 0.0 - 0.5 K/uL   Basophils Relative 0 %   Basophils Absolute 0.1 0.0 - 0.1 K/uL   Immature Granulocytes 1 %   Abs Immature Granulocytes 0.12 (H) 0.00 - 0.07 K/uL    Comment: Performed at [facility], [address], [place], [place] [address]  Troponin I (High Sensitivity)     Status: None   Collection Time: 12/07/2019  6:41 AM  Result Value Ref Range   Troponin I (High Sensitivity) 4 <18 ng/L    Comment: (NOTE) Elevated high sensitivity troponin I (hsTnI) values and significant  changes across serial measurements may suggest ACS but many other  chronic and acute conditions are known to elevate hsTnI results.  Refer to the Links section for chest pain algorithms and additional  guidance. Performed at [facility], [address], [place], [place]  [address]   D-dimer, quantitative     Status: Abnormal   Collection Time: 12-07-19  6:41 AM  Result Value Ref Range   D-Dimer, Quant 0.62 (H) 0.00 - 0.50 ug/mL-FEU    Comment: (NOTE) At the manufacturer cut-off of 0.50 ug/mL FEU, this assay has been documented to exclude PE with a sensitivity and negative predictive value of 97 to 99%.  At this time, this assay has not been approved by the FDA to exclude DVT/VTE. Results should be correlated with clinical presentation. Performed at [facility] [address] [place], [place] [address]   Lactic acid, plasma     Status: Abnormal   Collection Time: 12/07/19  7:33 AM  Result Value Ref Range   Lactic Acid, Venous 3.9 (HH) 0.5 - 1.9 mmol/L    Comment: CRITICAL RESULT CALLED TO, READ BACK BY AND VERIFIED WITH: HALL,C. RN AT [phone] 12/07/2019 MULLINS,T Performed at [facility], [address], [place], [place] [address]   CBG monitoring, ED     Status: Abnormal   Collection Time: 12/07/2019  7:42 AM  Result Value Ref Range   Glucose-Capillary >600 (HH) 70 - 99 mg/dL    Comment: Glucose reference range applies only to samples taken after fasting for at least 8 hours.  Troponin I (High Sensitivity)     Status: None   Collection Time: 12-07-2019  7:50 AM  Result Value Ref Range   Troponin I (High Sensitivity) 4 <18 ng/L    Comment: (NOTE) Elevated high sensitivity troponin I (hsTnI) values and significant  changes across serial measurements may suggest ACS but many other  chronic and acute conditions are known to elevate hsTnI results.  Refer to the "Links" section for chest pain algorithms and additional  guidance. Performed at [facility], [address] [place], [place] [address]  Basic metabolic panel     Status: Abnormal   Collection Time: 2019/12/14  7:50 AM  Result Value Ref Range   Sodium 140 135 - 145 mmol/L    Comment: REPEATED TO VERIFY   Potassium 6.0 (H) 3.5 - 5.1  mmol/L    Comment: REPEATED TO VERIFY SLIGHT HEMOLYSIS    Chloride 99 98 - 111 mmol/L    Comment: REPEATED TO VERIFY   CO2 10 (L) 22 - 32 mmol/L    Comment: REPEATED TO VERIFY   Glucose, Bld 926 (HH) 70 - 99 mg/dL    Comment: Glucose reference range applies only to samples taken after fasting for at least 8 hours. CRITICAL RESULT CALLED TO, READ BACK BY AND VERIFIED WITH: HALL,C. RN AT 6962 12/14/19 MULLINS,T    BUN 31 (H) 6 - 20 mg/dL   Creatinine, Ser 9.52 (H) 0.61 - 1.24 mg/dL   Calcium 84.1 8.9 - 32.4 mg/dL   GFR calc non Af Amer 26 (L) >60 mL/min   GFR calc Af Amer 30 (L) >60 mL/min   Anion gap 31 (H) 5 - 15    Comment: REPEATED TO VERIFY Performed at [facility], [address] [place], [place] [address]   Beta-hydroxybutyric acid     Status: Abnormal   Collection Time: 12/14/19  7:50 AM  Result Value Ref Range   Beta-Hydroxybutyric Acid >8.00 (H) 0.05 - 0.27 mmol/L    Comment: RESULTS CONFIRMED BY MANUAL DILUTION Performed at [facility], [address], [place], [place] [address]   Blood gas, venous     Status: Abnormal   Collection Time: 2019-12-14  7:50 AM  Result Value Ref Range   FIO2 21.00    pH, Ven 7.093 (LL) 7.250 - 7.430    Comment: CRITICAL RESULT CALLED TO, READ BACK BY AND VERIFIED WITH: HALL,C. RN AT 6440 14-Dec-2019 MULLINS,T    pCO2, Ven 35.6 (L) 44.0 - 60.0 mmHg   pO2, Ven 46.3 (H) 32.0 - 45.0 mmHg   Bicarbonate 10.4 (L) 20.0 - 28.0 mmol/L   Acid-base deficit 20.1 (H) 0.0 - 2.0 mmol/L   O2 Saturation 69.1 %   Patient temperature 98.7     Comment: Performed at [facility], [address], [place], [place] [address]  CBG monitoring, ED     Status: Abnormal   Collection Time: 12/14/2019  9:00 AM  Result Value Ref Range   Glucose-Capillary >600 (HH) 70 - 99 mg/dL    Comment: Glucose reference range applies only to samples taken after fasting for at least 8 hours.  CBG monitoring, ED     Status:  Abnormal   Collection Time: Dec 14, 2019  9:26 AM  Result Value Ref Range   Glucose-Capillary >600 (HH) 70 - 99 mg/dL    Comment: Glucose reference range applies only to samples taken after fasting for at least 8 hours.  CBG monitoring, ED     Status: Abnormal   Collection Time: 2019-12-14  9:59 AM  Result Value Ref Range   Glucose-Capillary >600 (HH) 70 - 99 mg/dL    Comment: Glucose reference range applies only to samples taken after fasting for at least 8 hours.  Blood gas, venous (at [facility], not at [facility])     Status: Abnormal   Collection Time: 2019-12-14 10:17 AM  Result Value Ref Range   FIO2 21.00    pH, Ven 7.092 (LL) 7.250 - 7.430    Comment: CRITICAL RESULT CALLED TO, READ BACK  BY AND VERIFIED WITH: ZULETA,C. RN AT 1045 12/15/2019 MULLINS,T    pCO2, Ven 29.4 (L) 44.0 - 60.0 mmHg   pO2, Ven 56.5 (H) 32.0 - 45.0 mmHg   Bicarbonate 8.5 (L) 20.0 - 28.0 mmol/L   Acid-base deficit 21.3 (H) 0.0 - 2.0 mmol/L   O2 Saturation 76.3 %   Patient temperature 98.6     Comment: Performed at [facility], [address] [place], [place] [address]  Lactic acid, plasma     Status: Abnormal   Collection Time: 11/30/2019 10:17 AM  Result Value Ref Range   Lactic Acid, Venous 3.6 (HH) 0.5 - 1.9 mmol/L    Comment: CRITICAL VALUE NOTED.  VALUE IS CONSISTENT WITH PREVIOUSLY REPORTED AND CALLED VALUE. Performed at [facility], [address] [place], [place] [address]   Basic metabolic panel     Status: Abnormal   Collection Time: 12/05/2019 10:17 AM  Result Value Ref Range   Sodium 146 (H) 135 - 145 mmol/L    Comment: REPEATED TO VERIFY   Potassium 4.5 3.5 - 5.1 mmol/L    Comment: DELTA CHECK NOTED   Chloride 110 98 - 111 mmol/L    Comment: REPEATED TO VERIFY   CO2 9 (L) 22 - 32 mmol/L    Comment: REPEATED TO VERIFY   Glucose, Bld 736 (HH) 70 - 99 mg/dL    Comment: Glucose reference range applies only to samples taken after fasting for at least 8  hours. CRITICAL RESULT CALLED TO, READ BACK BY AND VERIFIED WITH: ZULETA,C. RN AT 1045 11/30/2019 MULLINS,T    BUN 33 (H) 6 - 20 mg/dL   Creatinine, Ser 2.442.46 (H) 0.61 - 1.24 mg/dL   Calcium 8.7 (L) 8.9 - 10.3 mg/dL   GFR calc non Af Amer 35 (L) >60 mL/min   GFR calc Af Amer 40 (L) >60 mL/min   Anion gap 27 (H) 5 - 15    Comment: REPEATED TO VERIFY Performed at [facility], [address] [place], [place] [address]   CBG monitoring, ED     Status: Abnormal   Collection Time: 12/02/2019 10:27 AM  Result Value Ref Range   Glucose-Capillary >600 (HH) 70 - 99 mg/dL    Comment: Glucose reference range applies only to samples taken after fasting for at least 8 hours.  CBG monitoring, ED     Status: Abnormal   Collection Time: 11/24/2019 10:57 AM  Result Value Ref Range   Glucose-Capillary 548 (HH) 70 - 99 mg/dL    Comment: Glucose reference range applies only to samples taken after fasting for at least 8 hours.   Comment 1 Notify RN   CBG monitoring, ED     Status: Abnormal   Collection Time: 11/22/2019 11:28 AM  Result Value Ref Range   Glucose-Capillary 546 (HH) 70 - 99 mg/dL    Comment: Glucose reference range applies only to samples taken after fasting for at least 8 hours.   Comment 1 Notify RN   Basic metabolic panel     Status: Abnormal   Collection Time: 11/26/2019 11:56 AM  Result Value Ref Range   Sodium 150 (H) 135 - 145 mmol/L   Potassium 4.0 3.5 - 5.1 mmol/L   Chloride 115 (H) 98 - 111 mmol/L   CO2 11 (L) 22 - 32 mmol/L   Glucose, Bld 519 (HH) 70 - 99 mg/dL    Comment: Glucose reference range applies only to samples taken after fasting for at  least 8 hours. CRITICAL RESULT CALLED TO, READ BACK BY AND VERIFIED WITH: WEST,S. RN @1301  ON 05.13.2021 BY COHEN,K    BUN 31 (H) 6 - 20 mg/dL   Creatinine, Ser 2.33 (H) 0.61 - 1.24 mg/dL   Calcium 8.6 (L) 8.9 - 10.3 mg/dL   GFR calc non Af Amer 37 (L) >60 mL/min   GFR calc Af Amer 43 (L) >60 mL/min   Anion gap 24  (H) 5 - 15    Comment: Performed at [facility], [place] [address], [place], [place] [address]  HIV Antibody (routine testing w rflx)     Status: None   Collection Time: 12/19/2019 11:56 AM  Result Value Ref Range   HIV Screen 4th Generation wRfx Non Reactive Non Reactive    Comment: Performed at [facility], [place] [address] [place], [place] [address]  CBC     Status: Abnormal   Collection Time: 12/03/2019 11:56 AM  Result Value Ref Range   WBC 18.9 (H) 4.0 - 10.5 K/uL   RBC 6.32 (H) 4.22 - 5.81 MIL/uL   Hemoglobin 17.8 (H) 13.0 - 17.0 g/dL   HCT 54.9 (H) 39.0 - 52.0 %   MCV 86.9 80.0 - 100.0 fL   MCH 28.2 26.0 - 34.0 pg   MCHC 32.4 30.0 - 36.0 g/dL   RDW 14.9 11.5 - 15.5 %   Platelets 462 (H) 150 - 400 K/uL   nRBC 0.0 0.0 - 0.2 %    Comment: Performed at [facility], [place] [address] [place], [place] [address]  Hemoglobin A1c     Status: Abnormal   Collection Time: 11/20/2019 11:56 AM  Result Value Ref Range   Hgb A1c MFr Bld 11.6 (H) 4.8 - 5.6 %    Comment: (NOTE) Pre diabetes:          5.7%-6.4% Diabetes:              >6.4% Glycemic control for   <7.0% adults with diabetes    Mean Plasma Glucose 286.22 mg/dL    Comment: Performed at [place] [address] [place], [place] [address]  Phosphorus     Status: None   Collection Time: 11/28/2019 11:56 AM  Result Value Ref Range   Phosphorus 2.8 2.5 - 4.6 mg/dL    Comment: Performed at [facility], [place] [address] [place], [place] [address]  Magnesium     Status: Abnormal   Collection Time: 12/02/2019 11:56 AM  Result Value Ref Range   Magnesium 3.1 (H) 1.7 - 2.4 mg/dL    Comment: Performed at [facility], [place] [address], [place], [place] [address]  Lactic acid, plasma     Status: Abnormal   Collection Time: 11/25/2019 11:56 AM  Result Value Ref Range   Lactic Acid, Venous 3.2 (HH) 0.5 - 1.9 mmol/L    Comment: CRITICAL VALUE NOTED.  VALUE IS  CONSISTENT WITH PREVIOUSLY REPORTED AND CALLED VALUE. Performed at [facility], [place] [address], [place], [place] [address]   CBG monitoring, ED     Status: Abnormal   Collection Time: 12/14/2019 11:58 AM  Result Value Ref Range   Glucose-Capillary 459 (H) 70 - 99 mg/dL    Comment: Glucose reference range applies only to samples taken after fasting for at least 8 hours.  CBG monitoring, ED     Status: Abnormal   Collection Time: 11/30/2019 12:31 PM  Result Value Ref Range  Glucose-Capillary 450 (H) 70 - 99 mg/dL    Comment: Glucose reference range applies only to samples taken after fasting for at least 8 hours.  CBG monitoring, ED     Status: Abnormal   Collection Time: 12/24/19 12:59 PM  Result Value Ref Range   Glucose-Capillary 410 (H) 70 - 99 mg/dL    Comment: Glucose reference range applies only to samples taken after fasting for at least 8 hours.  CBG monitoring, ED     Status: Abnormal   Collection Time: 12/24/19  1:34 PM  Result Value Ref Range   Glucose-Capillary 412 (H) 70 - 99 mg/dL    Comment: Glucose reference range applies only to samples taken after fasting for at least 8 hours.  CBG monitoring, ED     Status: Abnormal   Collection Time: Dec 24, 2019  2:09 PM  Result Value Ref Range   Glucose-Capillary 384 (H) 70 - 99 mg/dL    Comment: Glucose reference range applies only to samples taken after fasting for at least 8 hours.  CBG monitoring, ED     Status: Abnormal   Collection Time: 12/24/2019  3:00 PM  Result Value Ref Range   Glucose-Capillary 308 (H) 70 - 99 mg/dL    Comment: Glucose reference range applies only to samples taken after fasting for at least 8 hours.  CBG monitoring, ED     Status: Abnormal   Collection Time: December 24, 2019  4:02 PM  Result Value Ref Range   Glucose-Capillary 242 (H) 70 - 99 mg/dL    Comment: Glucose reference range applies only to samples taken after fasting for at least 8 hours.  Basic metabolic panel     Status: Abnormal    Collection Time: 12-24-2019  4:52 PM  Result Value Ref Range   Sodium 155 (H) 135 - 145 mmol/L   Potassium 3.9 3.5 - 5.1 mmol/L   Chloride 122 (H) 98 - 111 mmol/L   CO2 17 (L) 22 - 32 mmol/L   Glucose, Bld 290 (H) 70 - 99 mg/dL    Comment: Glucose reference range applies only to samples taken after fasting for at least 8 hours.   BUN 33 (H) 6 - 20 mg/dL   Creatinine, Ser 5.78 (H) 0.61 - 1.24 mg/dL    Comment: DELTA CHECK NOTED REPEATED TO VERIFY    Calcium 9.0 8.9 - 10.3 mg/dL   GFR calc non Af Amer 24 (L) >60 mL/min   GFR calc Af Amer 27 (L) >60 mL/min   Anion gap 16 (H) 5 - 15    Comment: Performed at [facility], [address] [place], [place] [address]  Beta-hydroxybutyric acid     Status: Abnormal   Collection Time: Dec 24, 2019  4:52 PM  Result Value Ref Range   Beta-Hydroxybutyric Acid 3.19 (H) 0.05 - 0.27 mmol/L    Comment: Performed at [facility], [address] [place], [place] [address]  Lactic acid, plasma     Status: Abnormal   Collection Time: Dec 24, 2019  4:52 PM  Result Value Ref Range   Lactic Acid, Venous 5.0 (HH) 0.5 - 1.9 mmol/L    Comment: CRITICAL VALUE NOTED.  VALUE IS CONSISTENT WITH PREVIOUSLY REPORTED AND CALLED VALUE. Performed at [facility], [address], [place], [place] [address]   Triglycerides     Status: Abnormal   Collection Time: December 24, 2019  4:52 PM  Result Value Ref Range   Triglycerides 1,136 (H) <150 mg/dL    Comment: ICTERUS  AT THIS LEVEL MAY AFFECT RESULT RESULTS CONFIRMED BY MANUAL DILUTION Performed at [facility], [address], [place], [place] [address]   CBG monitoring, ED     Status: Abnormal   Collection Time: 11/27/2019  5:19 PM  Result Value Ref Range   Glucose-Capillary 234 (H) 70 - 99 mg/dL    Comment: Glucose reference range applies only to samples taken after fasting for at least 8 hours.  CBG monitoring, ED     Status: Abnormal   Collection  Time: 11/23/2019  6:40 PM  Result Value Ref Range   Glucose-Capillary 245 (H) 70 - 99 mg/dL    Comment: Glucose reference range applies only to samples taken after fasting for at least 8 hours.  Blood gas, arterial     Status: Abnormal   Collection Time: 11/25/2019  7:42 PM  Result Value Ref Range   FIO2 21.00    pH, Arterial 7.258 (L) 7.350 - 7.450   pCO2 arterial 28.7 (L) 32.0 - 48.0 mmHg   pO2, Arterial 83.6 83.0 - 108.0 mmHg   Bicarbonate 12.4 (L) 20.0 - 28.0 mmol/L   Acid-base deficit 13.3 (H) 0.0 - 2.0 mmol/L   O2 Saturation 95.3 %   Patient temperature 98.7    Allens test (pass/fail) PASS PASS    Comment: Performed at [facility], [address] [place], [place] [address]  CBG monitoring, ED     Status: Abnormal   Collection Time: 11/30/2019  7:42 PM  Result Value Ref Range   Glucose-Capillary 256 (H) 70 - 99 mg/dL    Comment: Glucose reference range applies only to samples taken after fasting for at least 8 hours.  CBG monitoring, ED     Status: Abnormal   Collection Time: 12/02/2019  8:35 PM  Result Value Ref Range   Glucose-Capillary 308 (H) 70 - 99 mg/dL    Comment: Glucose reference range applies only to samples taken after fasting for at least 8 hours.  CBG monitoring, ED     Status: Abnormal   Collection Time: 12/09/2019  9:22 PM  Result Value Ref Range   Glucose-Capillary 296 (H) 70 - 99 mg/dL    Comment: Glucose reference range applies only to samples taken after fasting for at least 8 hours.  Glucose, capillary     Status: Abnormal   Collection Time: 11/27/2019  9:45 PM  Result Value Ref Range   Glucose-Capillary 387 (H) 70 - 99 mg/dL    Comment: Glucose reference range applies only to samples taken after fasting for at least 8 hours.  Comprehensive metabolic panel     Status: Abnormal   Collection Time: 11/30/2019  9:47 PM  Result Value Ref Range   Sodium 151 (H) 135 - 145 mmol/L   Potassium 4.4 3.5 - 5.1 mmol/L   Chloride 120 (H) 98 - 111 mmol/L    CO2 13 (L) 22 - 32 mmol/L   Glucose, Bld 415 (H) 70 - 99 mg/dL    Comment: Glucose reference range applies only to samples taken after fasting for at least 8 hours.   BUN 39 (H) 6 - 20 mg/dL   Creatinine, Ser 6.29 (H) 0.61 - 1.24 mg/dL   Calcium 8.7 (L) 8.9 - 10.3 mg/dL   Total Protein 7.2 6.5 - 8.1 g/dL   Albumin 3.5 3.5 - 5.0 g/dL   AST 36 15 - 41 U/L   ALT 35 0 - 44 U/L   Alkaline Phosphatase 68 38 - 126 U/L  Total Bilirubin 1.4 (H) 0.3 - 1.2 mg/dL   GFR calc non Af Amer 16 (L) >60 mL/min   GFR calc Af Amer 19 (L) >60 mL/min   Anion gap 18 (H) 5 - 15    Comment: Performed at [facility], [address], [place], [place] [address]  Magnesium     Status: Abnormal   Collection Time: 12/12/2019  9:47 PM  Result Value Ref Range   Magnesium 2.7 (H) 1.7 - 2.4 mg/dL    Comment: Performed at [facility], [address], [place], [place] [address]  Phosphorus     Status: Abnormal   Collection Time: 12/10/2019  9:47 PM  Result Value Ref Range   Phosphorus 2.1 (L) 2.5 - 4.6 mg/dL    Comment: Performed at [facility], [address], [place], [place] [address]  CK     Status: None   Collection Time: 12/07/2019  9:48 PM  Result Value Ref Range   Total CK 326 49 - 397 U/L    Comment: Performed at [facility], [address], [place], [place] [address]  Lactic acid, plasma     Status: Abnormal   Collection Time: 12/18/2019  9:48 PM  Result Value Ref Range   Lactic Acid, Venous 4.0 (HH) 0.5 - 1.9 mmol/L    Comment: CRITICAL VALUE NOTED.  VALUE IS CONSISTENT WITH PREVIOUSLY REPORTED AND CALLED VALUE. Performed at [facility], [address] [place], [place] [address]   Cortisol     Status: None   Collection Time: 11/27/2019  9:48 PM  Result Value Ref Range   Cortisol, Plasma 88.2 ug/dL    Comment: RESULTS CONFIRMED BY MANUAL DILUTION (NOTE) AM    6.7 - 22.6 ug/dL PM   <13.0       ug/dL Performed  at [facility], [address], [place], [place] [address]   Blood gas, venous     Status: Abnormal   Collection Time: 11/23/2019  9:48 PM  Result Value Ref Range   pH, Ven 7.185 (LL) 7.250 - 7.430    Comment: CRITICAL RESULT CALLED TO, READ BACK BY AND VERIFIED WITH: YOUNTS, RN @ 2220 ON 12/06/2019 C VARNER    pCO2, Ven 34.0 (L) 44.0 - 60.0 mmHg   pO2, Ven 42.0 32.0 - 45.0 mmHg   Bicarbonate 12.3 (L) 20.0 - 28.0 mmol/L   Acid-base deficit 15.4 (H) 0.0 - 2.0 mmol/L   O2 Saturation 70.8 %   Patient temperature 98.6     Comment: Performed at [facility], [address], [place], [place] [address]  Glucose, capillary     Status: Abnormal   Collection Time: 11/24/2019 10:40 PM  Result Value Ref Range   Glucose-Capillary 329 (H) 70 - 99 mg/dL    Comment: Glucose reference range applies only to samples taken after fasting for at least 8 hours.  Glucose, capillary     Status: Abnormal   Collection Time: 12/02/19 12:20 AM  Result Value Ref Range   Glucose-Capillary 267 (H) 70 - 99 mg/dL    Comment: Glucose reference range applies only to samples taken after fasting for at least 8 hours.  Glucose, capillary     Status: Abnormal   Collection Time: 12/02/19  1:41 AM  Result Value Ref Range   Glucose-Capillary 282 (H) 70 - 99 mg/dL    Comment: Glucose reference range applies only to samples taken after fasting for at least  8 hours.  Basic metabolic panel     Status: Abnormal   Collection Time: 12/02/19  2:02 AM  Result Value Ref Range   Sodium 153 (H) 135 - 145 mmol/L   Potassium 2.8 (L) 3.5 - 5.1 mmol/L    Comment: DELTA CHECK NOTED   Chloride >130 (HH) 98 - 111 mmol/L    Comment: CRITICAL RESULT CALLED TO, READ BACK BY AND VERIFIED WITH: SHAY, RN @ 0305 ON 12/02/19 C VARNER    CO2 13 (L) 22 - 32 mmol/L   Glucose, Bld 142 (H) 70 - 99 mg/dL    Comment: Glucose reference range applies only to samples taken after fasting for at least 8 hours.   BUN 30 (H) 6 - 20  mg/dL   Creatinine, Ser 2.77 (H) 0.61 - 1.24 mg/dL   Calcium 5.7 (LL) 8.9 - 10.3 mg/dL    Comment: DELTA CHECK NOTED CRITICAL RESULT CALLED TO, READ BACK BY AND VERIFIED WITH: SHAY,RN @ 0305 ON 12/02/19 C VARNER    GFR calc non Af Amer 22 (L) >60 mL/min   GFR calc Af Amer 25 (L) >60 mL/min   Anion gap NOT CALCULATED 5 - 15    Comment: Performed at [facility], [address] [place], [place] [address]  Lactic acid, plasma     Status: Abnormal   Collection Time: 12/02/19  2:02 AM  Result Value Ref Range   Lactic Acid, Venous 4.4 (HH) 0.5 - 1.9 mmol/L    Comment: CRITICAL VALUE NOTED.  VALUE IS CONSISTENT WITH PREVIOUSLY REPORTED AND CALLED VALUE. Performed at [facility], [address] [place], [place] [address]   Beta-hydroxybutyric acid     Status: Abnormal   Collection Time: 12/02/19  2:02 AM  Result Value Ref Range   Beta-Hydroxybutyric Acid 0.96 (H) 0.05 - 0.27 mmol/L    Comment: Performed at [facility], [address] [place], [place] [address]  Glucose, capillary     Status: Abnormal   Collection Time: 12/02/19  2:42 AM  Result Value Ref Range   Glucose-Capillary 181 (H) 70 - 99 mg/dL    Comment: Glucose reference range applies only to samples taken after fasting for at least 8 hours.  Basic metabolic panel     Status: Abnormal   Collection Time: 12/02/19  3:24 AM  Result Value Ref Range   Sodium 153 (H) 135 - 145 mmol/L   Potassium 4.3 3.5 - 5.1 mmol/L    Comment: DELTA CHECK NOTED   Chloride 123 (H) 98 - 111 mmol/L   CO2 17 (L) 22 - 32 mmol/L   Glucose, Bld 143 (H) 70 - 99 mg/dL    Comment: Glucose reference range applies only to samples taken after fasting for at least 8 hours.   BUN 42 (H) 6 - 20 mg/dL   Creatinine, Ser 0.96 (H) 0.61 - 1.24 mg/dL    Comment: DELTA CHECK NOTED   Calcium 8.5 (L) 8.9 - 10.3 mg/dL    Comment: DELTA CHECK NOTED   GFR calc non Af Amer 13 (L) >60 mL/min   GFR calc Af Amer  15 (L) >60 mL/min   Anion gap 13 5 - 15    Comment: Performed at [facility], [address] [place], [place] [address]  Triglycerides     Status: Abnormal   Collection Time: 12/02/19  3:24 AM  Result Value Ref Range   Triglycerides 977 (H) <150 mg/dL    Comment:  Performed at [facility], [address] [place], [place] [address]  Glucose, capillary     Status: Abnormal   Collection Time: 12/02/19  3:47 AM  Result Value Ref Range   Glucose-Capillary 137 (H) 70 - 99 mg/dL    Comment: Glucose reference range applies only to samples taken after fasting for at least 8 hours.  CBC     Status: Abnormal   Collection Time: 12/02/19  4:43 AM  Result Value Ref Range   WBC 14.7 (H) 4.0 - 10.5 K/uL   RBC 6.18 (H) 4.22 - 5.81 MIL/uL   Hemoglobin 17.4 (H) 13.0 - 17.0 g/dL   HCT 60.4 (H) 54.0 - 98.1 %   MCV 84.6 80.0 - 100.0 fL   MCH 28.2 26.0 - 34.0 pg   MCHC 33.3 30.0 - 36.0 g/dL   RDW 19.1 47.8 - 29.5 %   Platelets 275 150 - 400 K/uL   nRBC 0.0 0.0 - 0.2 %    Comment: Performed at [facility], [address], [place], [place] [address]  Glucose, capillary     Status: Abnormal   Collection Time: 12/02/19  5:21 AM  Result Value Ref Range   Glucose-Capillary 118 (H) 70 - 99 mg/dL    Comment: Glucose reference range applies only to samples taken after fasting for at least 8 hours.  Basic metabolic panel     Status: Abnormal   Collection Time: 12/02/19  6:00 AM  Result Value Ref Range   Sodium 157 (H) 135 - 145 mmol/L   Potassium 4.1 3.5 - 5.1 mmol/L   Chloride 127 (H) 98 - 111 mmol/L   CO2 19 (L) 22 - 32 mmol/L   Glucose, Bld 101 (H) 70 - 99 mg/dL    Comment: Glucose reference range applies only to samples taken after fasting for at least 8 hours.   BUN 43 (H) 6 - 20 mg/dL   Creatinine, Ser 8.65 (H) 0.61 - 1.24 mg/dL   Calcium 8.5 (L) 8.9 - 10.3 mg/dL   GFR calc non Af Amer 10 (L) >60 mL/min   GFR calc Af Amer 12 (L) >60  mL/min   Anion gap 11 5 - 15    Comment: Performed at [facility], [address], [place], [place] [address]  Glucose, capillary     Status: Abnormal   Collection Time: 12/02/19  6:29 AM  Result Value Ref Range   Glucose-Capillary 106 (H) 70 - 99 mg/dL    Comment: Glucose reference range applies only to samples taken after fasting for at least 8 hours.  Glucose, capillary     Status: None   Collection Time: 12/02/19  7:38 AM  Result Value Ref Range   Glucose-Capillary 95 70 - 99 mg/dL    Comment: Glucose reference range applies only to samples taken after fasting for at least 8 hours.   Comment 1 Document in Chart   Glucose, capillary     Status: Abnormal   Collection Time: 12/02/19  8:35 AM  Result Value Ref Range   Glucose-Capillary 106 (H) 70 - 99 mg/dL    Comment: Glucose reference range applies only to samples taken after fasting for at least 8 hours.  Glucose, capillary     Status: Abnormal   Collection Time: 12/02/19  9:33 AM  Result Value Ref Range   Glucose-Capillary 114 (H) 70 - 99 mg/dL    Comment: Glucose reference range applies only to samples taken after fasting for  at least 8 hours.  Glucose, capillary     Status: Abnormal   Collection Time: 12/02/19 10:39 AM  Result Value Ref Range   Glucose-Capillary 129 (H) 70 - 99 mg/dL    Comment: Glucose reference range applies only to samples taken after fasting for at least 8 hours.   Comment 1 Notify RN    Comment 2 Document in Chart   Basic metabolic panel     Status: Abnormal   Collection Time: 12/02/19 11:33 AM  Result Value Ref Range   Sodium 153 (H) 135 - 145 mmol/L   Potassium 4.7 3.5 - 5.1 mmol/L   Chloride 121 (H) 98 - 111 mmol/L   CO2 18 (L) 22 - 32 mmol/L   Glucose, Bld 281 (H) 70 - 99 mg/dL    Comment: Glucose reference range applies only to samples taken after fasting for at least 8 hours.   BUN 42 (H) 6 - 20 mg/dL   Creatinine, Ser 1.61 (H) 0.61 - 1.24 mg/dL   Calcium 7.9 (L) 8.9 -  10.3 mg/dL   GFR calc non Af Amer 11 (L) >60 mL/min   GFR calc Af Amer 13 (L) >60 mL/min   Anion gap 14 5 - 15    Comment: Performed at [facility], [address] [place], [place] [address]  Glucose, capillary     Status: Abnormal   Collection Time: 12/02/19 11:42 AM  Result Value Ref Range   Glucose-Capillary 173 (H) 70 - 99 mg/dL    Comment: Glucose reference range applies only to samples taken after fasting for at least 8 hours.  Glucose, capillary     Status: Abnormal   Collection Time: 12/02/19 12:44 PM  Result Value Ref Range   Glucose-Capillary 213 (H) 70 - 99 mg/dL    Comment: Glucose reference range applies only to samples taken after fasting for at least 8 hours.    CT ABDOMEN PELVIS WO CONTRAST  Result Date: Dec 14, 2019 CLINICAL DATA:  Abdominal pain EXAM: CT ABDOMEN AND PELVIS WITHOUT CONTRAST TECHNIQUE: Multidetector CT imaging of the abdomen and pelvis was performed following the standard protocol without IV contrast. COMPARISON:  None. FINDINGS: Lower chest: No acute abnormality. Hepatobiliary: Markedly decreased attenuation of the hepatic parenchyma compatible with hepatic steatosis. No for focal liver lesion. Gallbladder appears unremarkable. No hyperdense gallstone. No biliary dilatation. Pancreas: Diffuse pancreatic enlargement with extensive peripancreatic stranding and surrounding peripancreatic fluid. No organized fluid collection. No pancreatic ductal dilatation. Spleen: Normal in size without focal abnormality. Adrenals/Urinary Tract: Adrenal glands are unremarkable. Kidneys are normal, without renal calculi, focal lesion, or hydronephrosis. Bladder is unremarkable. Stomach/Bowel: Stomach is within normal limits. Appendix appears normal (series 2, image 74). No evidence of bowel wall thickening, distention, or inflammatory changes. Vascular/Lymphatic: No significant vascular findings are present. No enlarged abdominal or pelvic lymph nodes.  Reproductive: Prostate is unremarkable. Other: No free fluid within the pelvis. No pneumoperitoneum. No abdominal wall hernia. Musculoskeletal: No acute or significant osseous findings. IMPRESSION: 1. Acute pancreatitis. No organized fluid collection. Please note that evaluation for parenchymal necrosis is limited in the absence of intravenous contrast. 2. Hepatic steatosis. Electronically Signed   By: Duanne Guess D.O.   On: 12-14-2019 14:57   CT HEAD WO CONTRAST  Result Date: 2019-12-14 CLINICAL DATA:  Generalized weakness and malaise EXAM: CT HEAD WITHOUT CONTRAST TECHNIQUE: Contiguous axial images were obtained from the base of the skull through the vertex without intravenous contrast. COMPARISON:  None. FINDINGS: Brain: Unenhanced images of  the brain demonstrate subtle hypodensity within the left frontal periventricular white matter. I do not see any significant mass effect or sulcal effacement. No evidence of hemorrhage. Lateral ventricles and midline structures are otherwise unremarkable. There are no acute extra-axial fluid collections. Vascular: No hyperdense vessel or unexpected calcification. Skull: Normal. Negative for fracture or focal lesion. Sinuses/Orbits: No acute finding. Other: None. IMPRESSION: 1. Subtle hypodensity within the left frontal periventricular white matter, without associated mass effect. Findings could be related to chronic periventricular leukomalacia or chronic posttraumatic change. MRI with and without contrast may be useful for further characterization. 2. No acute hemorrhage. Electronically Signed   By: Sharlet Salina M.D.   On: Dec 06, 2019 19:40   DG Chest Port 1 View  Result Date: 12/02/2019 CLINICAL DATA:  Encounter for central line placement. EXAM: PORTABLE CHEST 1 VIEW COMPARISON:  Chest radiograph 12/06/19 FINDINGS: Interval placement of a right IJ approach central venous catheter with tip projecting in the region of the superior cavoatrial junction. Unchanged  position of a left IJ approach central venous catheter with tip projecting in the region of the lower SVC or superior cavoatrial junction. Shallow inspiration radiograph with accentuation of the cardiac silhouette. Heart size likely within normal limits. No appreciable airspace consolidation within the lungs. No evidence of pleural effusion or pneumothorax. No acute bony abnormality. IMPRESSION: Interval placement of a right IJ approach central venous catheter with tip projecting in the region of the superior cavoatrial junction. Unchanged position of a left IJ approach central venous catheter with tip in the lower SVC or superior cavoatrial junction. Shallow inspiration radiograph. No evidence of acute cardiopulmonary abnormality. Electronically Signed   By: Jackey Loge DO   On: 12/02/2019 10:12   DG Chest Portable 1 View  Result Date: 12-06-2019 CLINICAL DATA:  Central line placement EXAM: PORTABLE CHEST 1 VIEW COMPARISON:  12-06-2019 FINDINGS: The heart size and mediastinal contours are within normal limits. Interval placement of a left-sided central venous catheter with the tip at the superior cavoatrial junction. There is again noted low lung volumes with subsegmental atelectasis. There is prominence of the central pulmonary vasculature. No acute osseous abnormality. IMPRESSION: Interval placement of left-sided central venous catheter with the tip at the superior cavoatrial junction. Overall shallow degree of aeration. Electronically Signed   By: Jonna Clark M.D.   On: 2019-12-06 21:36   DG Chest Port 1 View  Result Date: 12-06-19 CLINICAL DATA:  Shortness of breath EXAM: PORTABLE CHEST 1 VIEW COMPARISON:  None. FINDINGS: Normal heart size and mediastinal contours. Low volume chest no acute infiltrate or edema. No effusion or pneumothorax. No acute osseous findings. IMPRESSION: Negative low volume chest Electronically Signed   By: Marnee Spring M.D.   On: Dec 06, 2019 06:27   [place] EKG SITE  RITE  Result Date: 2019-12-06 If Site Rite image not attached, placement could not be confirmed due to current cardiac rhythm.  US Abdomen Limited RUQ  Result Date: 2019/12/06 CLINICAL DATA:  Acute pancreatitis EXAM: ULTRASOUND ABDOMEN LIMITED RIGHT UPPER QUADRANT COMPARISON:  CT from earlier in the same day. FINDINGS: Gallbladder: Not well visualized due to overlying bowel gas. Common bile duct: Diameter: Well visualized. Liver: Diffusely increased in echogenicity consistent with fatty infiltration. Portal vein is patent on color Doppler imaging with normal direction of blood flow towards the liver. Other: None. IMPRESSION: Fatty liver. Significant overlying bowel gas limits the remainder of the exam. Electronically Signed   By: Alcide Clever M.D.   On: 2019-12-06 17:39  Assessment/Plan 1.  AKI: acute oliguric AKi--> starting CRRT.  All 4K bath.  Stop bicarb gtt now that on it. Keep even.  No anticoag given pancreatitis  2.  Pancreatitis: CT abd/ pelvis.  TG 1100  3.  DKA: on insulin gtt  4.  Shock: likely septic, BC taken 5/13, abx/ pressor per PCCM  5.  Dispo:  ICU  Bufford Buttner 12/02/2019, 1:35 PM

## 2019-12-02 NOTE — Procedures (Signed)
Hemodialysis Catheter Insertion Procedure Note KIRT CHEW [id number] [birth year]  Procedure: Insertion of Hemodialysis Catheter Indications: Dialysis Access   Procedure Details Consent: Risks of procedure as well as the alternatives and risks of each were explained to the (patient/caregiver).  Consent for procedure obtained. Time Out: Verified patient identification, verified procedure, site/side was marked, verified correct patient position, special equipment/implants available, medications/allergies/relevent history reviewed, required imaging and test results available.  Performed  Maximum sterile technique was used including antiseptics, cap, gloves, gown, hand hygiene, mask and sheet. Skin prep: Chlorhexidine; local anesthetic administered Triple lumen hemodialysis catheter was inserted into right internal jugular vein using the Seldinger technique.  Evaluation Blood flow good Complications: No apparent complications Patient did tolerate procedure well. Chest X-ray ordered to verify placement.  CXR: normal.   Shelby Mattocks 12/02/2019 Simonne Martinet ACNP-BC [facility] Pulmonary/Critical Care Pager # [phone] OR # [phone] if no answer

## 2019-12-02 NOTE — Progress Notes (Signed)
CRITICAL VALUE ALERT  Critical Value:  PH 7.185  Date & Time Notied:  09-Dec-2019 2200  Provider Notified: [name] MD, at bedside  Orders Received/Actions taken: No new orders at this time

## 2019-12-03 DIAGNOSIS — R6521 Severe sepsis with septic shock: Secondary | ICD-10-CM

## 2019-12-03 DIAGNOSIS — N179 Acute kidney failure, unspecified: Secondary | ICD-10-CM

## 2019-12-03 DIAGNOSIS — K8582 Other acute pancreatitis with infected necrosis: Secondary | ICD-10-CM

## 2019-12-03 DIAGNOSIS — E1110 Type 2 diabetes mellitus with ketoacidosis without coma: Secondary | ICD-10-CM

## 2019-12-03 DIAGNOSIS — E781 Pure hyperglyceridemia: Secondary | ICD-10-CM

## 2019-12-03 DIAGNOSIS — A419 Sepsis, unspecified organism: Principal | ICD-10-CM

## 2019-12-03 DIAGNOSIS — D696 Thrombocytopenia, unspecified: Secondary | ICD-10-CM

## 2019-12-03 DIAGNOSIS — E872 Acidosis: Secondary | ICD-10-CM

## 2019-12-03 LAB — BASIC METABOLIC PANEL
Anion gap: 11 (ref 5–15)
Anion gap: 18 — ABNORMAL HIGH (ref 5–15)
BUN: 33 mg/dL — ABNORMAL HIGH (ref 6–20)
BUN: 29 mg/dL — ABNORMAL HIGH (ref 6–20)
CO2: 17 mmol/L — ABNORMAL LOW (ref 22–32)
CO2: 17 mmol/L — ABNORMAL LOW (ref 22–32)
Calcium: 7.1 mg/dL — ABNORMAL LOW (ref 8.9–10.3)
Calcium: 6.7 mg/dL — ABNORMAL LOW (ref 8.9–10.3)
Chloride: 113 mmol/L — ABNORMAL HIGH (ref 98–111)
Chloride: 106 mmol/L (ref 98–111)
Creatinine, Ser: 5.46 mg/dL — ABNORMAL HIGH (ref 0.61–1.24)
Creatinine, Ser: 4.35 mg/dL — ABNORMAL HIGH (ref 0.61–1.24)
GFR calc Af Amer: 15 mL/min — ABNORMAL LOW (ref >60)
GFR calc Af Amer: 20 mL/min — ABNORMAL LOW (ref >60)
GFR calc non Af Amer: 13 mL/min — ABNORMAL LOW (ref >60)
GFR calc non Af Amer: 17 mL/min — ABNORMAL LOW (ref >60)
Glucose, Bld: 179 mg/dL — ABNORMAL HIGH (ref 70–99)
Glucose, Bld: 206 mg/dL — ABNORMAL HIGH (ref 70–99)
Potassium: 4.4 mmol/L (ref 3.5–5.1)
Potassium: 4.4 mmol/L (ref 3.5–5.1)
Sodium: 141 mmol/L (ref 135–145)
Sodium: 141 mmol/L (ref 135–145)

## 2019-12-03 LAB — CBC
HCT: 43.4 % (ref 39.0–52.0)
Hemoglobin: 14.4 g/dL (ref 13.0–17.0)
MCH: 28.4 pg (ref 26.0–34.0)
MCHC: 33.2 g/dL (ref 30.0–36.0)
MCV: 85.6 fL (ref 80.0–100.0)
Platelets: 91 10*3/uL — ABNORMAL LOW (ref 150–400)
RBC: 5.07 MIL/uL (ref 4.22–5.81)
RDW: 15.7 % — ABNORMAL HIGH (ref 11.5–15.5)
WBC: 14.9 10*3/uL — ABNORMAL HIGH (ref 4.0–10.5)
nRBC: 1.8 % — ABNORMAL HIGH (ref 0.0–0.2)

## 2019-12-03 LAB — RENAL FUNCTION PANEL
Albumin: 2.8 g/dL — ABNORMAL LOW (ref 3.5–5.0)
Albumin: 2.8 g/dL — ABNORMAL LOW (ref 3.5–5.0)
Anion gap: 12 (ref 5–15)
Anion gap: 18 — ABNORMAL HIGH (ref 5–15)
BUN: 33 mg/dL — ABNORMAL HIGH (ref 6–20)
BUN: 30 mg/dL — ABNORMAL HIGH (ref 6–20)
CO2: 16 mmol/L — ABNORMAL LOW (ref 22–32)
CO2: 17 mmol/L — ABNORMAL LOW (ref 22–32)
Calcium: 7.1 mg/dL — ABNORMAL LOW (ref 8.9–10.3)
Calcium: 6.8 mg/dL — ABNORMAL LOW (ref 8.9–10.3)
Chloride: 113 mmol/L — ABNORMAL HIGH (ref 98–111)
Chloride: 108 mmol/L (ref 98–111)
Creatinine, Ser: 5.32 mg/dL — ABNORMAL HIGH (ref 0.61–1.24)
Creatinine, Ser: 4.32 mg/dL — ABNORMAL HIGH (ref 0.61–1.24)
GFR calc Af Amer: 16 mL/min — ABNORMAL LOW (ref >60)
GFR calc Af Amer: 20 mL/min — ABNORMAL LOW (ref >60)
GFR calc non Af Amer: 14 mL/min — ABNORMAL LOW (ref >60)
GFR calc non Af Amer: 18 mL/min — ABNORMAL LOW (ref >60)
Glucose, Bld: 177 mg/dL — ABNORMAL HIGH (ref 70–99)
Glucose, Bld: 203 mg/dL — ABNORMAL HIGH (ref 70–99)
Phosphorus: 2.6 mg/dL (ref 2.5–4.6)
Phosphorus: 5.2 mg/dL — ABNORMAL HIGH (ref 2.5–4.6)
Potassium: 4.3 mmol/L (ref 3.5–5.1)
Potassium: 4.4 mmol/L (ref 3.5–5.1)
Sodium: 141 mmol/L (ref 135–145)
Sodium: 143 mmol/L (ref 135–145)

## 2019-12-03 LAB — GLUCOSE, CAPILLARY
Glucose-Capillary: 102 mg/dL — ABNORMAL HIGH (ref 70–99)
Glucose-Capillary: 110 mg/dL — ABNORMAL HIGH (ref 70–99)
Glucose-Capillary: 113 mg/dL — ABNORMAL HIGH (ref 70–99)
Glucose-Capillary: 114 mg/dL — ABNORMAL HIGH (ref 70–99)
Glucose-Capillary: 118 mg/dL — ABNORMAL HIGH (ref 70–99)
Glucose-Capillary: 118 mg/dL — ABNORMAL HIGH (ref 70–99)
Glucose-Capillary: 121 mg/dL — ABNORMAL HIGH (ref 70–99)
Glucose-Capillary: 123 mg/dL — ABNORMAL HIGH (ref 70–99)
Glucose-Capillary: 124 mg/dL — ABNORMAL HIGH (ref 70–99)
Glucose-Capillary: 124 mg/dL — ABNORMAL HIGH (ref 70–99)
Glucose-Capillary: 128 mg/dL — ABNORMAL HIGH (ref 70–99)
Glucose-Capillary: 130 mg/dL — ABNORMAL HIGH (ref 70–99)
Glucose-Capillary: 132 mg/dL — ABNORMAL HIGH (ref 70–99)
Glucose-Capillary: 140 mg/dL — ABNORMAL HIGH (ref 70–99)
Glucose-Capillary: 148 mg/dL — ABNORMAL HIGH (ref 70–99)
Glucose-Capillary: 162 mg/dL — ABNORMAL HIGH (ref 70–99)
Glucose-Capillary: 75 mg/dL (ref 70–99)
Glucose-Capillary: 98 mg/dL (ref 70–99)
Glucose-Capillary: 98 mg/dL (ref 70–99)
Glucose-Capillary: 98 mg/dL (ref 70–99)

## 2019-12-03 LAB — BLOOD GAS, ARTERIAL
Acid-base deficit: 11 mmol/L — ABNORMAL HIGH (ref 0.0–2.0)
Bicarbonate: 14.3 mmol/L — ABNORMAL LOW (ref 20.0–28.0)
O2 Saturation: 97.2 %
Patient temperature: 98.6
pCO2 arterial: 31.4 mmHg — ABNORMAL LOW (ref 32.0–48.0)
pH, Arterial: 7.282 — ABNORMAL LOW (ref 7.350–7.450)
pO2, Arterial: 113 mmHg — ABNORMAL HIGH (ref 83.0–108.0)

## 2019-12-03 LAB — BLOOD CULTURE ID PANEL (REFLEXED)

## 2019-12-03 LAB — TRIGLYCERIDES: Triglycerides: 431 mg/dL — ABNORMAL HIGH (ref <150)

## 2019-12-03 LAB — MRSA PCR SCREENING: MRSA by PCR: NEGATIVE

## 2019-12-03 LAB — MAGNESIUM: Magnesium: 2.4 mg/dL (ref 1.7–2.4)

## 2019-12-03 MED ORDER — FENTANYL CITRATE (PF) 100 MCG/2ML IJ SOLN
INTRAMUSCULAR | Status: AC
  Filled 2019-12-03: qty 2

## 2019-12-03 MED ORDER — HYDROMORPHONE HCL 1 MG/ML IJ SOLN
0.5000 mg | INTRAMUSCULAR | Status: DC | PRN
  Administered 2019-12-03: 0.5 mg via INTRAVENOUS
  Filled 2019-12-03 (×2): qty 1

## 2019-12-03 MED ORDER — CALCIUM GLUCONATE-NACL 1-0.675 GM/50ML-% IV SOLN
1.0000 g | Freq: Once | INTRAVENOUS | Status: AC
  Administered 2019-12-03: 1000 mg via INTRAVENOUS
  Filled 2019-12-03: qty 50

## 2019-12-03 MED ORDER — STERILE WATER FOR INJECTION IV SOLN
INTRAVENOUS | Status: DC
  Filled 2019-12-03 (×58): qty 150

## 2019-12-03 MED ORDER — ETOMIDATE 2 MG/ML IV SOLN
INTRAVENOUS | Status: AC
  Filled 2019-12-03: qty 20

## 2019-12-03 MED ORDER — EPINEPHRINE 1 MG/10ML IJ SOSY
PREFILLED_SYRINGE | INTRAMUSCULAR | Status: AC
  Filled 2019-12-03: qty 10

## 2019-12-03 MED ORDER — SODIUM BICARBONATE 8.4 % IV SOLN
INTRAVENOUS | Status: AC
  Administered 2019-12-04: 50 meq via INTRAVENOUS
  Filled 2019-12-03: qty 50

## 2019-12-03 MED ORDER — MIDAZOLAM HCL 2 MG/2ML IJ SOLN
INTRAMUSCULAR | Status: AC
  Filled 2019-12-03: qty 4

## 2019-12-03 MED ORDER — VANCOMYCIN HCL 1500 MG/300ML IV SOLN
1500.0000 mg | INTRAVENOUS | Status: DC
  Administered 2019-12-04 – 2019-12-05 (×2): 1500 mg via INTRAVENOUS
  Filled 2019-12-03 (×2): qty 300

## 2019-12-03 MED ORDER — VANCOMYCIN HCL 10 G IV SOLR
2500.0000 mg | Freq: Once | INTRAVENOUS | Status: AC
  Administered 2019-12-03: 2500 mg via INTRAVENOUS
  Filled 2019-12-03: qty 2000

## 2019-12-03 MED ORDER — SODIUM CHLORIDE 0.9 % IV SOLN
1.2500 ng/kg/min | INTRAVENOUS | Status: DC
  Administered 2019-12-03: 1.25 ng/kg/min via INTRAVENOUS
  Administered 2019-12-04: 40 ng/kg/min via INTRAVENOUS
  Administered 2019-12-04: 30 ng/kg/min via INTRAVENOUS
  Administered 2019-12-04 – 2019-12-05 (×2): 40 ng/kg/min via INTRAVENOUS
  Filled 2019-12-03 (×6): qty 1

## 2019-12-03 MED ORDER — HYDROCORTISONE NA SUCCINATE PF 100 MG IJ SOLR
50.0000 mg | Freq: Four times a day (QID) | INTRAMUSCULAR | Status: DC
  Administered 2019-12-03 – 2019-12-08 (×19): 50 mg via INTRAVENOUS
  Filled 2019-12-03 (×19): qty 2

## 2019-12-03 MED ORDER — SODIUM BICARBONATE 8.4 % IV SOLN
50.0000 meq | Freq: Once | INTRAVENOUS | Status: AC
  Administered 2019-12-03: 50 meq via INTRAVENOUS

## 2019-12-03 NOTE — Progress Notes (Signed)
eLink Physician-Brief Progress Note Patient Name: Edward Woods DOB: 10-Feb-1991 MRN: [id number]   Date of Service  12/03/2019  HPI/Events of Note  Nursing concerned about ST elevation on bedside monitor.   eICU Interventions  Will order:\ 1. 12 Lead EKG STAT. Rounding team to read.      Intervention Category Major Interventions: Other:  Lenell Antu 12/03/2019, 6:58 AM

## 2019-12-03 NOTE — Progress Notes (Signed)
Pharmacy Antibiotic Note  Edward Woods is a 29 y.o. male admitted on 2019/12/12 with (new-onset DM) w/ anion gap acidosis and pancreatitis. He is currently on CRRT and meropenem for pancreatitis. One of 4 blood cx collected on 5/13 has GPC in clusters (BCID= staph species). Per Dr. Arsenio Loader, start vancomycin for suspected bacteremia.  Plan: - vancomycin 2500 mg IV x1 bolus, then 1500 mg IV q24h  ________________________________  Height: 6' (182.9 cm) Weight: 120 kg (264 lb 8.8 oz) IBW/kg (Calculated) : 77.6  Temp (24hrs), Avg:102.6 F (39.2 C), Min:97.7 F (36.5 C), Max:104 F (40 C)  Recent Labs  Lab 12-12-2019 0641 12/12/2019 0733 12-12-19 1017 12/12/2019 1017 12-12-2019 1156 12-12-2019 1156 12-12-19 1652 Dec 12, 2019 2147 12/12/19 2148 12/02/19 0202 12/02/19 0202 12/02/19 0324 12/02/19 0443 12/02/19 0600 12/02/19 1133 12/02/19 1400 12/02/19 1600  WBC 20.5*  --   --   --  18.9*  --   --   --   --   --   --   --  14.7*  --   --   --   --   CREATININE 2.78*   < > 2.46*   < > 2.33*   < > 3.36*   < >  --  3.59*   < > 5.65*  --  6.71* 6.21* 5.96* 5.92*  LATICACIDVEN  --    < > 3.6*  --  3.2*  --  5.0*  --  4.0* 4.4*  --   --   --   --   --   --   --    < > = values in this interval not displayed.    Estimated Creatinine Clearance: 25.1 mL/min (A) (by C-G formula based on SCr of 5.92 mg/dL (H)).    No Known Allergies   Thank you for allowing pharmacy to be a part of this patient's care.  [name] 12/03/2019 12:36 AM

## 2019-12-03 NOTE — Treatment Plan (Signed)
Called regarding worsening acidosis off bicarb infusion. Changed post fluids to isotonic bicarb. Will CTM acid base status and make adjustments as needed.

## 2019-12-03 NOTE — Progress Notes (Addendum)
PHARMACY - PHYSICIAN COMMUNICATION CRITICAL VALUE ALERT - BLOOD CULTURE IDENTIFICATION (BCID)  Edward Woods is an 29 y.o. male who presented to [facility] on 12/07/2019 with a chief complaint of generalized weakness and was found to be in DKA and pancreatitis. Patient's currently on CRRT and meropenem for pancreatitis.  Name of physician (or Provider) Contacted: Dr. Arsenio Loader  Current antibiotics: meropenem  Changes to prescribed antibiotics recommended:  - add vancomycin to abx regimen  Results for orders placed or performed during the hospital encounter of 12-07-19  Blood Culture ID Panel (Reflexed) (Collected: 2019/12/07  4:53 PM)  Result Value Ref Range   Enterococcus species NOT DETECTED NOT DETECTED   Listeria monocytogenes NOT DETECTED NOT DETECTED   Staphylococcus species DETECTED (A) NOT DETECTED   Staphylococcus aureus (BCID) NOT DETECTED NOT DETECTED   Methicillin resistance NOT DETECTED NOT DETECTED   Streptococcus species NOT DETECTED NOT DETECTED   Streptococcus agalactiae NOT DETECTED NOT DETECTED   Streptococcus pneumoniae NOT DETECTED NOT DETECTED   Streptococcus pyogenes NOT DETECTED NOT DETECTED   Acinetobacter baumannii NOT DETECTED NOT DETECTED   Enterobacteriaceae species NOT DETECTED NOT DETECTED   Enterobacter cloacae complex NOT DETECTED NOT DETECTED   Escherichia coli NOT DETECTED NOT DETECTED   Klebsiella oxytoca NOT DETECTED NOT DETECTED   Klebsiella pneumoniae NOT DETECTED NOT DETECTED   Proteus species NOT DETECTED NOT DETECTED   Serratia marcescens NOT DETECTED NOT DETECTED   Haemophilus influenzae NOT DETECTED NOT DETECTED   Neisseria meningitidis NOT DETECTED NOT DETECTED   Pseudomonas aeruginosa NOT DETECTED NOT DETECTED   Candida albicans NOT DETECTED NOT DETECTED   Candida glabrata NOT DETECTED NOT DETECTED   Candida krusei NOT DETECTED NOT DETECTED   Candida parapsilosis NOT DETECTED NOT DETECTED   Candida tropicalis NOT DETECTED NOT  DETECTED    [name] 12/03/2019  12:13 AM

## 2019-12-03 NOTE — Progress Notes (Signed)
NAME:  Edward Woods, MRN:  [id number], DOB:  [birth year], LOS: 2 ADMISSION DATE:  12/03/19, CONSULTATION DATE:  5/13 REFERRING MD:  Jonathon Bellows, CHIEF COMPLAINT: Diabetic ketoacidosis with acute metabolic encephalopathy  Brief History    29 year old male who presented to the emergency room on 5/13 with chief complaint of weakness, fatigue and malaise. Also reporting worsening abdominal discomfort and approximately 2 to 3 days of progressive shortness of breath with diffuse chest discomfort.  He had had nausea vomiting, had not been able to keep any foods down.  Denied being around any sick exposures, no fever chills cough hemoptysis diarrhea or constipation.  In the emergency room he was found to be tachycardic, normotensive, tachypneic, serum bicarbonate 9, glucose 908, creatinine 2.78, anion gap 30, mild elevated ALT of 50, lipase 342, white blood cell count 20.5 with hemoglobin of 18.6, serum lactate of 3.9 beta hydroxybutyric acid was greater than 8.  He was to be admitted with a working diagnosis of acute diabetic ketoacidosis with resultant acute metabolic encephalopathy.  IV fluids were administered, he received 4 L of crystalloid resuscitation, IV insulin was started, lactic acid still remain elevated in spite of fluid resuscitation.  Because of profound metabolic derangements and altered sensorium critical care was asked to assist with care and evaluate.  Past Medical History   No known medical history Significant Hospital Events   5/13 admitted with working diagnosis of DKA, administered IV fluids, insulin started . Awake, does not off quickly, but now oriented x3.  Nursing reports remarkably improved level of consciousness since admission  Consults:  Critical care consulted 5/13  Procedures:  HD catheter 5/14 A-line 5/14  Significant Diagnostic Tests:  Beta hydroxybutyric acid was greater than 8 Lipase was 342   Micro Data:  BCX2 5/13>>> CONS 2/4  Antimicrobials:   5/13 -  Zosyn 5/14 -Merrem >> 5/14 vanc>>   Interim history/subjective:  Increasing vasopressor requirements.  He denies complaints other than being tired.   Objective   Blood pressure (!) 62/47, pulse 95, temperature (!) 97.2 F (36.2 C), temperature source Oral, resp. rate (!) 24, height 6' (1.829 m), weight (!) 164.2 kg, SpO2 (!) 75 %. CVP:  [12 mmHg] 12 mmHg   Intake/Output Summary (Last 24 hours) at 12/03/2019 1230 Last data filed at 12/03/2019 1100 Gross per 24 hour  Intake 3887.96 ml  Output 2588 ml  Net 1299.96 ml   Filed Weights   Dec 03, 2019 0750 12/02/19 0830 12/03/19 0417  Weight: 120 kg (!) (P) 166.9 kg (!) 164.2 kg      General Appearance: Critically ill-appearing young man lying in bed no acute distress HEENT: [place]/AT, eyes anicteric. Dry lips. Neck: Supple, right IJ dialysis, left IJ triple-lumen catheter Lungs: Breathing comfortably on room air, tachypnea without accessory muscle use.  Clear to auscultation bilaterally. Heart: S1-S2, regular rate and rhythm, no murmurs Abdomen: Obese, distended, soft Extremities: Mottling of distal lower extremities, difficult to palpate and Doppler pulses in bilateral ankles and feet.  Right femoral A-line. Skin: Mottled and lower extremities.  Skin warm.  No rashes. Neurologic: Sleepy, but arouses to stimulation.  With low blood pressures is very sluggish, but with improvement in blood pressures is very conversant and appropriate.  Globally weak.      Resolved Hospital Problem list     Assessment & Plan:  Acute metabolic encephalopathy worsened with hypotension -Intubation watch -Maintain MAP greater than 65.  DKA with lactic acidosis- HgbA1C 11.6 at admission. Triggered by acute pancreatitis due to hypertriglyceridemia -  Continue insulin infusion since starting steroids today -Goal BG 140-180 while admitted to the ICU  Pancreatitis - due to High TGL -Continue supportive care -N.p.o. -Continue to monitor calcium  AKI/ATN  with anuria -Continue CRRT -Trial off bicarb drip today; worsening shock throughout the day.  Discussed with nephrology who started post filter bicarb.  Circulatory shock - due to sepsis, acidosis, pancreatitis- worsening today -Continue vasopressors-norepinephrine, vasopressin, angiotensin II to maintain MAP greater than 65. -Continue meropenem and vancomycin for source control -Echocardiogram -Stress dose steroids  Hypocalcemia 2/2 pancreatitis & hyperphosphatemia -1 g calcium gluconate -Continue to monitor  Hyperphosphatemia due to renal failure -Continue to monitor -Avoid PO intake -phos binders when able to take PO  Positive blood cultures- CONS in 1/2 sites -Likely contaminant, but repeat blood cultures to be sure -Continue empiric antibiotics  Thrombocytopenia likely due to acute illness, less likely antibiotics. 4T score=2, low probability. -Monitor for signs of bleeding -No indication for transfusion currently  Best practice:  Diet: N.p.o.  Pain/Anxiety/Delirium protocol (if indicated):  VAP protocol (if indicated): Not indicated DVT prophylaxis:  heparin GI prophylaxis: PPI Glucose control: Phase 2 Mobility: Bedrest for now Code Status: Full code Family Communication: updated patient, who requested that his parents not be updated while they are out of town today. Mother Cabell Lazenby emergency contact. Disposition: ICU    LABS    PULMONARY Recent Labs  Lab 12/17/2019 0750 12/17/19 1017 2019/12/17 1942 2019/12/17 2148 12/02/19 1603  PHART  --   --  7.258*  --   --   PCO2ART  --   --  28.7*  --   --   PO2ART  --   --  83.6  --   --   HCO3 10.4* 8.5* 12.4* 12.3* 15.6*  O2SAT 69.1 76.3 95.3 70.8 76.8    CBC Recent Labs  Lab 12/17/2019 0641 12-17-2019 1156 12/02/19 0443  HGB 18.6* 17.8* 17.4*  HCT 57.4* 54.9* 52.3*  WBC 20.5* 18.9* 14.7*  PLT 540* 462* 275    COAGULATION No results for input(s): INR in the last 168 hours.  CARDIAC  No results  for input(s): TROPONINI in the last 168 hours. No results for input(s): PROBNP in the last 168 hours.   CHEMISTRY Recent Labs  Lab 2019/12/17 1156 12/17/2019 1652 Dec 17, 2019 2147 12/02/19 0202 12/02/19 0600 12/02/19 0600 12/02/19 1133 12/02/19 1133 12/02/19 1400 12/02/19 1400 12/02/19 1600 12/03/19 0256  NA 150*   < > 151*   < > 157*  --  153*  --  148*  --  148* 141  141  K 4.0   < > 4.4   < > 4.1   < > 4.7   < > 4.4   < > 4.3 4.3  4.4  CL 115*   < > 120*   < > 127*  --  121*  --  114*  --  114* 113*  113*  CO2 11*   < > 13*   < > 19*  --  18*  --  18*  --  19* 16*  17*  GLUCOSE 519*   < > 415*   < > 101*  --  281*  --  228*  --  228* 177*  179*  BUN 31*   < > 39*   < > 43*  --  42*  --  39*  --  39* 33*  33*  CREATININE 2.33*   < > 4.56*   < > 6.71*  --  6.21*  --  5.96*  --  5.92* 5.32*  5.46*  CALCIUM 8.6*   < > 8.7*   < > 8.5*  --  7.9*  --  7.4*  --  7.4* 7.1*  7.1*  MG 3.1*  --  2.7*  --   --   --   --   --   --   --   --  2.4  PHOS 2.8  --  2.1*  --   --   --   --   --   --   --  1.6* 2.6   < > = values in this interval not displayed.   Estimated Creatinine Clearance: 33.1 mL/min (A) (by C-G formula based on SCr of 5.32 mg/dL (H)).   LIVER Recent Labs  Lab 11/26/2019 0641 11/19/2019 2147 12/02/19 1600 12/03/19 0256  AST 28 36  --   --   ALT 50* 35  --   --   ALKPHOS 97 68  --   --   BILITOT 2.4* 1.4*  --   --   PROT 9.7* 7.2  --   --   ALBUMIN 4.8 3.5 3.0* 2.8*     INFECTIOUS Recent Labs  Lab 12/14/2019 1652 11/25/2019 2148 12/02/19 0202  LATICACIDVEN 5.0* 4.0* 4.4*     ENDOCRINE CBG (last 3)  Recent Labs    12/03/19 0956 12/03/19 1057 12/03/19 1212  GLUCAP 102* 118* 121*         IMAGING x48h  - image(s) personally visualized  -   highlighted in bold CT ABDOMEN PELVIS WO CONTRAST  Result Date: 11/23/2019 CLINICAL DATA:  Abdominal pain EXAM: CT ABDOMEN AND PELVIS WITHOUT CONTRAST TECHNIQUE: Multidetector CT imaging of the abdomen and  pelvis was performed following the standard protocol without IV contrast. COMPARISON:  None. FINDINGS: Lower chest: No acute abnormality. Hepatobiliary: Markedly decreased attenuation of the hepatic parenchyma compatible with hepatic steatosis. No for focal liver lesion. Gallbladder appears unremarkable. No hyperdense gallstone. No biliary dilatation. Pancreas: Diffuse pancreatic enlargement with extensive peripancreatic stranding and surrounding peripancreatic fluid. No organized fluid collection. No pancreatic ductal dilatation. Spleen: Normal in size without focal abnormality. Adrenals/Urinary Tract: Adrenal glands are unremarkable. Kidneys are normal, without renal calculi, focal lesion, or hydronephrosis. Bladder is unremarkable. Stomach/Bowel: Stomach is within normal limits. Appendix appears normal (series 2, image 74). No evidence of bowel wall thickening, distention, or inflammatory changes. Vascular/Lymphatic: No significant vascular findings are present. No enlarged abdominal or pelvic lymph nodes. Reproductive: Prostate is unremarkable. Other: No free fluid within the pelvis. No pneumoperitoneum. No abdominal wall hernia. Musculoskeletal: No acute or significant osseous findings. IMPRESSION: 1. Acute pancreatitis. No organized fluid collection. Please note that evaluation for parenchymal necrosis is limited in the absence of intravenous contrast. 2. Hepatic steatosis. Electronically Signed   By: Davina Poke D.O.   On: 11/21/2019 14:57   CT HEAD WO CONTRAST  Result Date: 12/03/2019 CLINICAL DATA:  Generalized weakness and malaise EXAM: CT HEAD WITHOUT CONTRAST TECHNIQUE: Contiguous axial images were obtained from the base of the skull through the vertex without intravenous contrast. COMPARISON:  None. FINDINGS: Brain: Unenhanced images of the brain demonstrate subtle hypodensity within the left frontal periventricular white matter. I do not see any significant mass effect or sulcal effacement. No  evidence of hemorrhage. Lateral ventricles and midline structures are otherwise unremarkable. There are no acute extra-axial fluid collections. Vascular: No hyperdense vessel or unexpected calcification. Skull: Normal. Negative for fracture or focal lesion. Sinuses/Orbits: No acute finding. Other: None. IMPRESSION:  1. Subtle hypodensity within the left frontal periventricular white matter, without associated mass effect. Findings could be related to chronic periventricular leukomalacia or chronic posttraumatic change. MRI with and without contrast may be useful for further characterization. 2. No acute hemorrhage. Electronically Signed   By: Sharlet Salina M.D.   On: 05-Dec-2019 19:40   DG Chest Port 1 View  Result Date: 12/02/2019 CLINICAL DATA:  Encounter for central line placement. EXAM: PORTABLE CHEST 1 VIEW COMPARISON:  Chest radiograph 12/05/19 FINDINGS: Interval placement of a right IJ approach central venous catheter with tip projecting in the region of the superior cavoatrial junction. Unchanged position of a left IJ approach central venous catheter with tip projecting in the region of the lower SVC or superior cavoatrial junction. Shallow inspiration radiograph with accentuation of the cardiac silhouette. Heart size likely within normal limits. No appreciable airspace consolidation within the lungs. No evidence of pleural effusion or pneumothorax. No acute bony abnormality. IMPRESSION: Interval placement of a right IJ approach central venous catheter with tip projecting in the region of the superior cavoatrial junction. Unchanged position of a left IJ approach central venous catheter with tip in the lower SVC or superior cavoatrial junction. Shallow inspiration radiograph. No evidence of acute cardiopulmonary abnormality. Electronically Signed   By: Jackey Loge DO   On: 12/02/2019 10:12   DG Chest Portable 1 View  Result Date: 05-Dec-2019 CLINICAL DATA:  Central line placement EXAM: PORTABLE CHEST  1 VIEW COMPARISON:  12/05/2019 FINDINGS: The heart size and mediastinal contours are within normal limits. Interval placement of a left-sided central venous catheter with the tip at the superior cavoatrial junction. There is again noted low lung volumes with subsegmental atelectasis. There is prominence of the central pulmonary vasculature. No acute osseous abnormality. IMPRESSION: Interval placement of left-sided central venous catheter with the tip at the superior cavoatrial junction. Overall shallow degree of aeration. Electronically Signed   By: Jonna Sandee Bernath M.D.   On: 12/05/2019 21:36   [place] EKG SITE RITE  Result Date: Dec 05, 2019 If Site Rite image not attached, placement could not be confirmed due to current cardiac rhythm.  US Abdomen Limited RUQ  Result Date: 2019/12/05 CLINICAL DATA:  Acute pancreatitis EXAM: ULTRASOUND ABDOMEN LIMITED RIGHT UPPER QUADRANT COMPARISON:  CT from earlier in the same day. FINDINGS: Gallbladder: Not well visualized due to overlying bowel gas. Common bile duct: Diameter: Well visualized. Liver: Diffusely increased in echogenicity consistent with fatty infiltration. Portal vein is patent on color Doppler imaging with normal direction of blood flow towards the liver. Other: None. IMPRESSION: Fatty liver. Significant overlying bowel gas limits the remainder of the exam. Electronically Signed   By: Alcide Clever M.D.   On: Dec 05, 2019 17:39     This patient is critically ill with multiple organ system failure which requires frequent high complexity decision making, assessment, support, evaluation, and titration of therapies. This was completed through the application of advanced monitoring technologies and extensive interpretation of multiple databases. During this encounter critical care time was devoted to patient care services described in this note for 64 minutes. Steffanie Dunn, DO 12/03/19 7:50 PM [facility]

## 2019-12-03 NOTE — Progress Notes (Signed)
[facility] Progress Note    Assessment/ Plan:   1.  AKI: acute oliguric AKi--> continue CRRT.  All 4K bath.  Stop bicarb gtt now that on it. Keep even.  No anticoag given pancreatitis.  Increase effluent dose, if PM bicarb not better will change post- fluids to isotonic bicarb gtt  2.  Pancreatitis: CT abd/ pelvis showing pancreatitis, lack of IV contrast difficult to determine necrosis or not. Abd [place] with fatty liver.  TG 1100--> 531 this AM.  3.  DKA: on insulin gtt  4.  Shock: likely septic/ distriubutive in setting of pancreatitis, BC taken 5/13, abx/ pressor per PCCM  5.  Acute encephalopathy: CT head L frontal periventricular white matter.  Per primary  6.  Dispo:  ICU  Subjective:    Has an a-line now.  Still altered sensorium.  CRRT ongoing, K ok, bicarb a little low yet.  Lactate 4.4 this AM.     Objective:   BP (!) 62/47   Pulse 94   Temp (!) 97.2 F (36.2 C) (Oral)   Resp (!) 25   Ht 6' (1.829 m)   Wt (!) 164.2 kg   SpO2 (!) 84%   BMI 49.10 kg/m   Intake/Output Summary (Last 24 hours) at 12/03/2019 1115 Last data filed at 12/03/2019 1000 Gross per 24 hour  Intake 3879.59 ml  Output 2454 ml  Net 1425.59 ml   Weight change: 44.2 kg  Physical Exam: GEN: appears ill, lying in bed HEENT EOMI PERRL  NECK  No JVD PULM clear anteriorly CV tachycardic, no m/r/g ABD soft, diffusely tender EXT trace edema NEURO obtunded SKIN no rashes ACCESS: R IJ nontunneled HD cath  Imaging: CT ABDOMEN PELVIS WO CONTRAST  Result Date: 12/11/2019 CLINICAL DATA:  Abdominal pain EXAM: CT ABDOMEN AND PELVIS WITHOUT CONTRAST TECHNIQUE: Multidetector CT imaging of the abdomen and pelvis was performed following the standard protocol without IV contrast. COMPARISON:  None. FINDINGS: Lower chest: No acute abnormality. Hepatobiliary: Markedly decreased attenuation of the hepatic parenchyma compatible with hepatic steatosis. No for focal liver lesion. Gallbladder  appears unremarkable. No hyperdense gallstone. No biliary dilatation. Pancreas: Diffuse pancreatic enlargement with extensive peripancreatic stranding and surrounding peripancreatic fluid. No organized fluid collection. No pancreatic ductal dilatation. Spleen: Normal in size without focal abnormality. Adrenals/Urinary Tract: Adrenal glands are unremarkable. Kidneys are normal, without renal calculi, focal lesion, or hydronephrosis. Bladder is unremarkable. Stomach/Bowel: Stomach is within normal limits. Appendix appears normal (series 2, image 74). No evidence of bowel wall thickening, distention, or inflammatory changes. Vascular/Lymphatic: No significant vascular findings are present. No enlarged abdominal or pelvic lymph nodes. Reproductive: Prostate is unremarkable. Other: No free fluid within the pelvis. No pneumoperitoneum. No abdominal wall hernia. Musculoskeletal: No acute or significant osseous findings. IMPRESSION: 1. Acute pancreatitis. No organized fluid collection. Please note that evaluation for parenchymal necrosis is limited in the absence of intravenous contrast. 2. Hepatic steatosis. Electronically Signed   By: Davina Poke D.O.   On: 12/18/2019 14:57   CT HEAD WO CONTRAST  Result Date: 12/07/2019 CLINICAL DATA:  Generalized weakness and malaise EXAM: CT HEAD WITHOUT CONTRAST TECHNIQUE: Contiguous axial images were obtained from the base of the skull through the vertex without intravenous contrast. COMPARISON:  None. FINDINGS: Brain: Unenhanced images of the brain demonstrate subtle hypodensity within the left frontal periventricular white matter. I do not see any significant mass effect or sulcal effacement. No evidence of hemorrhage. Lateral ventricles and midline structures are otherwise unremarkable. There are no  acute extra-axial fluid collections. Vascular: No hyperdense vessel or unexpected calcification. Skull: Normal. Negative for fracture or focal lesion. Sinuses/Orbits: No acute  finding. Other: None. IMPRESSION: 1. Subtle hypodensity within the left frontal periventricular white matter, without associated mass effect. Findings could be related to chronic periventricular leukomalacia or chronic posttraumatic change. MRI with and without contrast may be useful for further characterization. 2. No acute hemorrhage. Electronically Signed   By: Sharlet Salina M.D.   On: 11/23/2019 19:40   DG Chest Port 1 View  Result Date: 12/02/2019 CLINICAL DATA:  Encounter for central line placement. EXAM: PORTABLE CHEST 1 VIEW COMPARISON:  Chest radiograph 12/10/2019 FINDINGS: Interval placement of a right IJ approach central venous catheter with tip projecting in the region of the superior cavoatrial junction. Unchanged position of a left IJ approach central venous catheter with tip projecting in the region of the lower SVC or superior cavoatrial junction. Shallow inspiration radiograph with accentuation of the cardiac silhouette. Heart size likely within normal limits. No appreciable airspace consolidation within the lungs. No evidence of pleural effusion or pneumothorax. No acute bony abnormality. IMPRESSION: Interval placement of a right IJ approach central venous catheter with tip projecting in the region of the superior cavoatrial junction. Unchanged position of a left IJ approach central venous catheter with tip in the lower SVC or superior cavoatrial junction. Shallow inspiration radiograph. No evidence of acute cardiopulmonary abnormality. Electronically Signed   By: Jackey Loge DO   On: 12/02/2019 10:12   DG Chest Portable 1 View  Result Date: 12/02/2019 CLINICAL DATA:  Central line placement EXAM: PORTABLE CHEST 1 VIEW COMPARISON:  Dec 01, 2019 FINDINGS: The heart size and mediastinal contours are within normal limits. Interval placement of a left-sided central venous catheter with the tip at the superior cavoatrial junction. There is again noted low lung volumes with subsegmental  atelectasis. There is prominence of the central pulmonary vasculature. No acute osseous abnormality. IMPRESSION: Interval placement of left-sided central venous catheter with the tip at the superior cavoatrial junction. Overall shallow degree of aeration. Electronically Signed   By: Jonna Clark M.D.   On: 12/12/2019 21:36   [place] EKG SITE RITE  Result Date: 11/25/2019 If Site Rite image not attached, placement could not be confirmed due to current cardiac rhythm.  US Abdomen Limited RUQ  Result Date: 12/07/2019 CLINICAL DATA:  Acute pancreatitis EXAM: ULTRASOUND ABDOMEN LIMITED RIGHT UPPER QUADRANT COMPARISON:  CT from earlier in the same day. FINDINGS: Gallbladder: Not well visualized due to overlying bowel gas. Common bile duct: Diameter: Well visualized. Liver: Diffusely increased in echogenicity consistent with fatty infiltration. Portal vein is patent on color Doppler imaging with normal direction of blood flow towards the liver. Other: None. IMPRESSION: Fatty liver. Significant overlying bowel gas limits the remainder of the exam. Electronically Signed   By: Alcide Clever M.D.   On: 11/25/2019 17:39    Labs: BMET Recent Labs  Lab 11/29/2019 1156 12/10/2019 1652 12/11/2019 2147 12/02/2019 2147 12/02/19 0202 12/02/19 0324 12/02/19 0600 12/02/19 1133 12/02/19 1400 12/02/19 1600 12/03/19 0256  NA 150*   < > 151*   < > 153* 153* 157* 153* 148* 148* 141  141  K 4.0   < > 4.4   < > 2.8* 4.3 4.1 4.7 4.4 4.3 4.3  4.4  CL 115*   < > 120*   < > >130* 123* 127* 121* 114* 114* 113*  113*  CO2 11*   < > 13*   < >  13* 17* 19* 18* 18* 19* 16*  17*  GLUCOSE 519*   < > 415*   < > 142* 143* 101* 281* 228* 228* 177*  179*  BUN 31*   < > 39*   < > 30* 42* 43* 42* 39* 39* 33*  33*  CREATININE 2.33*   < > 4.56*   < > 3.59* 5.65* 6.71* 6.21* 5.96* 5.92* 5.32*  5.46*  CALCIUM 8.6*   < > 8.7*   < > 5.7* 8.5* 8.5* 7.9* 7.4* 7.4* 7.1*  7.1*  PHOS 2.8  --  2.1*  --   --   --   --   --   --  1.6* 2.6   < > =  values in this interval not displayed.   CBC Recent Labs  Lab 2019-12-15 0641 2019-12-15 1156 12/02/19 0443  WBC 20.5* 18.9* 14.7*  NEUTROABS 16.7*  --   --   HGB 18.6* 17.8* 17.4*  HCT 57.4* 54.9* 52.3*  MCV 86.7 86.9 84.6  PLT 540* 462* 275    Medications:    . Chlorhexidine Gluconate Cloth  6 each Topical Daily  . heparin injection (subcutaneous)  5,000 Units Subcutaneous Q8H  . mouth rinse  15 mL Mouth Rinse BID  . pantoprazole (PROTONIX) IV  40 mg Intravenous Q24H      Bufford Buttner MD 12/03/2019, 11:15 AM

## 2019-12-03 NOTE — Progress Notes (Signed)
Inpatient Diabetes Program Recommendations  AACE/ADA: New Consensus Statement on Inpatient Glycemic Control   Target Ranges:  Prepandial:   less than 140 mg/dL      Peak postprandial:   less than 180 mg/dL (1-2 hours)      Critically ill patients:  140 - 180 mg/dL  Results for DAVIED, NOCITO (MRN [id number]) as of 12/03/2019 09:18  Ref. Range 12/03/2019 00:45 12/03/2019 01:57 12/03/2019 02:54 12/03/2019 03:57 12/03/2019 05:27 12/03/2019 06:32 12/03/2019 07:46 12/03/2019 08:50  Glucose-Capillary Latest Ref Range: 70 - 99 mg/dL 034 (H) 917 (H) 915 (H) 130 (H) 123 (H) 118 (H) 98 98   Results for VERONICA, GUERRANT (MRN [id number]) as of 12/03/2019 09:18  Ref. Range 12/12/2019 11:56  Hemoglobin A1C Latest Ref Range: 4.8 - 5.6 % 11.6 (H)   Review of Glycemic Control  Diabetes history: NO Outpatient Diabetes medications: NA Current orders for Inpatient glycemic control: IV insulin  Inpatient Diabetes Program Recommendations:    Insulin at transition from IV to SQ: Once MD is ready to transition from IV to SQ insulin, please consider ordering Levemir 25 units Q24H (based on 164 kg x 0.15 units), CBGs Q4H, and Novolog 0-9 units Q4H.  Thanks, Orlando Penner, RN, MSN, CDE Diabetes Coordinator Inpatient Diabetes Program [phone] (Team Pager from 8am to 5pm)

## 2019-12-03 NOTE — Progress Notes (Signed)
Late entry 0930 - unable to doppler pulses radial, dorsal pedis or posterior tibial, extremities cool and feet slightly mottled, patient denies any pain, on Levophed at 20 mcg/min and Vassopressin at 0.04 units/min

## 2019-12-03 NOTE — Procedures (Signed)
Arterial Catheter Insertion Procedure Note Edward Woods [id number] [birth year]  Procedure: Insertion of Arterial Catheter  Indications: Blood pressure monitoring and Frequent blood sampling  Procedure Details Consent: Risks of procedure as well as the alternatives and risks of each were explained to the (patient/caregiver).  Consent for procedure obtained. Time Out: Verified patient identification, verified procedure, site/side was marked, verified correct patient position, special equipment/implants available, medications/allergies/relevent history reviewed, required imaging and test results available.  Performed  Maximum sterile technique was used including antiseptics, cap, gloves, gown, hand hygiene and mask. Skin prep: Chlorhexidine; local anesthetic administered 20 gauge catheter was inserted into right femoral artery using the Seldinger technique. ULTRASOUND GUIDANCE USED: YES Evaluation Blood flow good; BP tracing good. Complications: No apparent complications.  ___________________ [name] [name], MD [facility]

## 2019-12-04 ENCOUNTER — Inpatient Hospital Stay (HOSPITAL_COMMUNITY): Payer: Medicaid Other

## 2019-12-04 DIAGNOSIS — R7401 Elevation of levels of liver transaminase levels: Secondary | ICD-10-CM

## 2019-12-04 DIAGNOSIS — Z9911 Dependence on respirator [ventilator] status: Secondary | ICD-10-CM

## 2019-12-04 DIAGNOSIS — R6521 Severe sepsis with septic shock: Secondary | ICD-10-CM

## 2019-12-04 DIAGNOSIS — K8591 Acute pancreatitis with uninfected necrosis, unspecified: Secondary | ICD-10-CM

## 2019-12-04 DIAGNOSIS — J9601 Acute respiratory failure with hypoxia: Secondary | ICD-10-CM

## 2019-12-04 DIAGNOSIS — G9340 Encephalopathy, unspecified: Secondary | ICD-10-CM

## 2019-12-04 LAB — BLOOD GAS, ARTERIAL
Acid-base deficit: 19.2 mmol/L — ABNORMAL HIGH (ref 0.0–2.0)
Acid-base deficit: 25.3 mmol/L — ABNORMAL HIGH (ref 0.0–2.0)
Bicarbonate: 7.6 mmol/L — ABNORMAL LOW (ref 20.0–28.0)
Bicarbonate: 5.5 mmol/L — ABNORMAL LOW (ref 20.0–28.0)
FIO2: 100
MECHVT: 620 mL
O2 Saturation: 98.7 %
O2 Saturation: 98.3 %
Patient temperature: 98.6
Patient temperature: 98.6
RATE: 25 resp/min
pCO2 arterial: 20.7 mmHg — ABNORMAL LOW (ref 32.0–48.0)
pCO2 arterial: 25.3 mmHg — ABNORMAL LOW (ref 32.0–48.0)
pH, Arterial: 7.191 — CL (ref 7.350–7.450)
pH, Arterial: 6.966 — CL (ref 7.350–7.450)
pO2, Arterial: 241 mmHg — ABNORMAL HIGH (ref 83.0–108.0)
pO2, Arterial: 313 mmHg — ABNORMAL HIGH (ref 83.0–108.0)

## 2019-12-04 LAB — CBC
HCT: 44.6 % (ref 39.0–52.0)
HCT: 33.3 % — ABNORMAL LOW (ref 39.0–52.0)
Hemoglobin: 14.7 g/dL (ref 13.0–17.0)
Hemoglobin: 10.1 g/dL — ABNORMAL LOW (ref 13.0–17.0)
MCH: 28.2 pg (ref 26.0–34.0)
MCH: 28.8 pg (ref 26.0–34.0)
MCHC: 33 g/dL (ref 30.0–36.0)
MCHC: 30.3 g/dL (ref 30.0–36.0)
MCV: 85.6 fL (ref 80.0–100.0)
MCV: 94.9 fL (ref 80.0–100.0)
Platelets: 95 10*3/uL — ABNORMAL LOW (ref 150–400)
Platelets: 85 10*3/uL — ABNORMAL LOW (ref 150–400)
RBC: 5.21 MIL/uL (ref 4.22–5.81)
RBC: 3.51 MIL/uL — ABNORMAL LOW (ref 4.22–5.81)
RDW: 15.7 % — ABNORMAL HIGH (ref 11.5–15.5)
RDW: 16.6 % — ABNORMAL HIGH (ref 11.5–15.5)
WBC: 17.8 10*3/uL — ABNORMAL HIGH (ref 4.0–10.5)
WBC: 27.1 10*3/uL — ABNORMAL HIGH (ref 4.0–10.5)
nRBC: 4.2 % — ABNORMAL HIGH (ref 0.0–0.2)
nRBC: 8.9 % — ABNORMAL HIGH (ref 0.0–0.2)

## 2019-12-04 LAB — COMPREHENSIVE METABOLIC PANEL
ALT: 4715 U/L — ABNORMAL HIGH (ref 0–44)
AST: 3443 U/L — ABNORMAL HIGH (ref 15–41)
Albumin: 1.9 g/dL — ABNORMAL LOW (ref 3.5–5.0)
Alkaline Phosphatase: 227 U/L — ABNORMAL HIGH (ref 38–126)
Anion gap: 38 — ABNORMAL HIGH (ref 5–15)
BUN: 27 mg/dL — ABNORMAL HIGH (ref 6–20)
CO2: 6 mmol/L — ABNORMAL LOW (ref 22–32)
Calcium: 6.7 mg/dL — ABNORMAL LOW (ref 8.9–10.3)
Chloride: 97 mmol/L — ABNORMAL LOW (ref 98–111)
Creatinine, Ser: 4.51 mg/dL — ABNORMAL HIGH (ref 0.61–1.24)
GFR calc Af Amer: 19 mL/min — ABNORMAL LOW (ref >60)
GFR calc non Af Amer: 17 mL/min — ABNORMAL LOW (ref >60)
Glucose, Bld: 321 mg/dL — ABNORMAL HIGH (ref 70–99)
Potassium: 5.1 mmol/L (ref 3.5–5.1)
Sodium: 141 mmol/L (ref 135–145)
Total Bilirubin: 2.4 mg/dL — ABNORMAL HIGH (ref 0.3–1.2)
Total Protein: 4.6 g/dL — ABNORMAL LOW (ref 6.5–8.1)

## 2019-12-04 LAB — GLUCOSE, CAPILLARY
Glucose-Capillary: 100 mg/dL — ABNORMAL HIGH (ref 70–99)
Glucose-Capillary: 108 mg/dL — ABNORMAL HIGH (ref 70–99)
Glucose-Capillary: 133 mg/dL — ABNORMAL HIGH (ref 70–99)
Glucose-Capillary: 135 mg/dL — ABNORMAL HIGH (ref 70–99)
Glucose-Capillary: 138 mg/dL — ABNORMAL HIGH (ref 70–99)
Glucose-Capillary: 171 mg/dL — ABNORMAL HIGH (ref 70–99)
Glucose-Capillary: 173 mg/dL — ABNORMAL HIGH (ref 70–99)
Glucose-Capillary: 177 mg/dL — ABNORMAL HIGH (ref 70–99)
Glucose-Capillary: 179 mg/dL — ABNORMAL HIGH (ref 70–99)
Glucose-Capillary: 183 mg/dL — ABNORMAL HIGH (ref 70–99)
Glucose-Capillary: 225 mg/dL — ABNORMAL HIGH (ref 70–99)
Glucose-Capillary: 238 mg/dL — ABNORMAL HIGH (ref 70–99)
Glucose-Capillary: 241 mg/dL — ABNORMAL HIGH (ref 70–99)
Glucose-Capillary: 252 mg/dL — ABNORMAL HIGH (ref 70–99)
Glucose-Capillary: 289 mg/dL — ABNORMAL HIGH (ref 70–99)
Glucose-Capillary: 67 mg/dL — ABNORMAL LOW (ref 70–99)
Glucose-Capillary: 85 mg/dL (ref 70–99)
Glucose-Capillary: 89 mg/dL (ref 70–99)
Glucose-Capillary: 99 mg/dL (ref 70–99)

## 2019-12-04 LAB — RENAL FUNCTION PANEL
Albumin: 3 g/dL — ABNORMAL LOW (ref 3.5–5.0)
Albumin: 2 g/dL — ABNORMAL LOW (ref 3.5–5.0)
Anion gap: 20 — ABNORMAL HIGH (ref 5–15)
Anion gap: 34 — ABNORMAL HIGH (ref 5–15)
BUN: 27 mg/dL — ABNORMAL HIGH (ref 6–20)
BUN: 25 mg/dL — ABNORMAL HIGH (ref 6–20)
CO2: 21 mmol/L — ABNORMAL LOW (ref 22–32)
CO2: 7 mmol/L — ABNORMAL LOW (ref 22–32)
Calcium: 7 mg/dL — ABNORMAL LOW (ref 8.9–10.3)
Calcium: 7.4 mg/dL — ABNORMAL LOW (ref 8.9–10.3)
Chloride: 98 mmol/L (ref 98–111)
Chloride: 100 mmol/L (ref 98–111)
Creatinine, Ser: 3.75 mg/dL — ABNORMAL HIGH (ref 0.61–1.24)
Creatinine, Ser: 4.4 mg/dL — ABNORMAL HIGH (ref 0.61–1.24)
GFR calc Af Amer: 24 mL/min — ABNORMAL LOW (ref >60)
GFR calc Af Amer: 20 mL/min — ABNORMAL LOW (ref >60)
GFR calc non Af Amer: 21 mL/min — ABNORMAL LOW (ref >60)
GFR calc non Af Amer: 17 mL/min — ABNORMAL LOW (ref >60)
Glucose, Bld: 169 mg/dL — ABNORMAL HIGH (ref 70–99)
Glucose, Bld: 304 mg/dL — ABNORMAL HIGH (ref 70–99)
Phosphorus: 6.3 mg/dL — ABNORMAL HIGH (ref 2.5–4.6)
Phosphorus: 9.1 mg/dL — ABNORMAL HIGH (ref 2.5–4.6)
Potassium: 4.7 mmol/L (ref 3.5–5.1)
Potassium: 5 mmol/L (ref 3.5–5.1)
Sodium: 139 mmol/L (ref 135–145)
Sodium: 141 mmol/L (ref 135–145)

## 2019-12-04 LAB — HEPATIC FUNCTION PANEL
ALT: 4117 U/L — ABNORMAL HIGH (ref 0–44)
Albumin: 3 g/dL — ABNORMAL LOW (ref 3.5–5.0)
Alkaline Phosphatase: 145 U/L — ABNORMAL HIGH (ref 38–126)
Bilirubin, Direct: 1.3 mg/dL — ABNORMAL HIGH (ref 0.0–0.2)
Indirect Bilirubin: 1.6 mg/dL — ABNORMAL HIGH (ref 0.3–0.9)
Total Bilirubin: 2.9 mg/dL — ABNORMAL HIGH (ref 0.3–1.2)
Total Protein: 7.2 g/dL (ref 6.5–8.1)

## 2019-12-04 LAB — MAGNESIUM
Magnesium: 2.6 mg/dL — ABNORMAL HIGH (ref 1.7–2.4)
Magnesium: 2.7 mg/dL — ABNORMAL HIGH (ref 1.7–2.4)

## 2019-12-04 LAB — BASIC METABOLIC PANEL
Anion gap: 24 — ABNORMAL HIGH (ref 5–15)
BUN: 26 mg/dL — ABNORMAL HIGH (ref 6–20)
CO2: 14 mmol/L — ABNORMAL LOW (ref 22–32)
Calcium: 6.7 mg/dL — ABNORMAL LOW (ref 8.9–10.3)
Chloride: 98 mmol/L (ref 98–111)
Creatinine, Ser: 3.61 mg/dL — ABNORMAL HIGH (ref 0.61–1.24)
GFR calc Af Amer: 25 mL/min — ABNORMAL LOW (ref >60)
GFR calc non Af Amer: 22 mL/min — ABNORMAL LOW (ref >60)
Glucose, Bld: 177 mg/dL — ABNORMAL HIGH (ref 70–99)
Potassium: 4.7 mmol/L (ref 3.5–5.1)
Sodium: 136 mmol/L (ref 135–145)

## 2019-12-04 LAB — PROTIME-INR
INR: 2.1 — ABNORMAL HIGH (ref 0.8–1.2)
Prothrombin Time: 22.6 seconds — ABNORMAL HIGH (ref 11.4–15.2)

## 2019-12-04 LAB — TRIGLYCERIDES: Triglycerides: 253 mg/dL — ABNORMAL HIGH (ref <150)

## 2019-12-04 LAB — CULTURE, BLOOD (ROUTINE X 2): Special Requests: ADEQUATE

## 2019-12-04 LAB — LACTIC ACID, PLASMA
Lactic Acid, Venous: >11 mmol/L (ref 0.5–1.9)
Lactic Acid, Venous: >11 mmol/L (ref 0.5–1.9)
Lactic Acid, Venous: >11 mmol/L (ref 0.5–1.9)

## 2019-12-04 LAB — ECHOCARDIOGRAM COMPLETE
Height: 72 in
Weight: 5756.65 oz

## 2019-12-04 LAB — ABO/RH: ABO/RH(D): O POS

## 2019-12-04 LAB — AMMONIA: Ammonia: 102 umol/L — ABNORMAL HIGH (ref 9–35)

## 2019-12-04 MED ORDER — EPINEPHRINE 1 MG/10ML IJ SOSY
PREFILLED_SYRINGE | INTRAMUSCULAR | Status: AC
  Administered 2019-12-04: 1 mg
  Filled 2019-12-04: qty 10

## 2019-12-04 MED ORDER — IOHEXOL 300 MG/ML  SOLN
100.0000 mL | Freq: Once | INTRAMUSCULAR | Status: AC | PRN
  Administered 2019-12-04: 100 mL via INTRAVENOUS

## 2019-12-04 MED ORDER — AMIODARONE HCL IN DEXTROSE 360-4.14 MG/200ML-% IV SOLN: 30.0000 mg/h | INTRAVENOUS | Status: DC

## 2019-12-04 MED ORDER — SUCCINYLCHOLINE CHLORIDE 200 MG/10ML IV SOSY
PREFILLED_SYRINGE | INTRAVENOUS | Status: AC
  Administered 2019-12-04: 100 mg
  Filled 2019-12-04: qty 10

## 2019-12-04 MED ORDER — IOHEXOL 9 MG/ML PO SOLN: 500.0000 mL | ORAL | Status: AC

## 2019-12-04 MED ORDER — FENTANYL CITRATE (PF) 100 MCG/2ML IJ SOLN
50.0000 ug | INTRAMUSCULAR | Status: AC | PRN
  Administered 2019-12-05 – 2019-12-06 (×3): 50 ug via INTRAVENOUS
  Filled 2019-12-04 (×3): qty 2

## 2019-12-04 MED ORDER — SODIUM BICARBONATE-DEXTROSE 150-5 MEQ/L-% IV SOLN
150.0000 meq | INTRAVENOUS | Status: DC
  Administered 2019-12-04 – 2019-12-05 (×4): 150 meq via INTRAVENOUS
  Filled 2019-12-04 (×2): qty 1000

## 2019-12-04 MED ORDER — SODIUM CHLORIDE 0.9% IV SOLUTION: Freq: Once | INTRAVENOUS | Status: AC

## 2019-12-04 MED ORDER — SENNA 8.6 MG PO TABS: 1.0000 | ORAL_TABLET | Freq: Every day | ORAL | Status: DC

## 2019-12-04 MED ORDER — SODIUM BICARBONATE 8.4 % IV SOLN
100.0000 meq | Freq: Once | INTRAVENOUS | Status: AC
  Administered 2019-12-04: 100 meq via INTRAVENOUS

## 2019-12-04 MED ORDER — SODIUM BICARBONATE 8.4 % IV SOLN
INTRAVENOUS | Status: AC
  Administered 2019-12-04: 50 meq via INTRAVENOUS
  Filled 2019-12-04: qty 50

## 2019-12-04 MED ORDER — MIDAZOLAM HCL 2 MG/2ML IJ SOLN
2.0000 mg | INTRAMUSCULAR | Status: AC | PRN
  Administered 2019-12-05 – 2019-12-06 (×3): 2 mg via INTRAVENOUS
  Filled 2019-12-04 (×5): qty 2

## 2019-12-04 MED ORDER — ONDANSETRON HCL 4 MG/2ML IJ SOLN
4.0000 mg | Freq: Once | INTRAMUSCULAR | Status: AC
  Administered 2019-12-04: 4 mg via INTRAVENOUS

## 2019-12-04 MED ORDER — EPINEPHRINE HCL 5 MG/250ML IV SOLN IN NS
0.5000 ug/min | INTRAVENOUS | Status: DC
  Administered 2019-12-04: 0.5 ug/min via INTRAVENOUS
  Administered 2019-12-04 – 2019-12-05 (×9): 35 ug/min via INTRAVENOUS
  Filled 2019-12-04 (×14): qty 250

## 2019-12-04 MED ORDER — POLYETHYLENE GLYCOL 3350 17 G PO PACK
17.0000 g | PACK | Freq: Every day | ORAL | Status: DC
  Administered 2019-12-04: 17 g via ORAL
  Filled 2019-12-04: qty 1

## 2019-12-04 MED ORDER — DEXTROSE 50 % IV SOLN
INTRAVENOUS | Status: AC
  Administered 2019-12-04: 12.5 g via INTRAVENOUS
  Filled 2019-12-04: qty 50

## 2019-12-04 MED ORDER — CHLORHEXIDINE GLUCONATE 0.12% ORAL RINSE (MEDLINE KIT)
15.0000 mL | Freq: Two times a day (BID) | OROMUCOSAL | Status: DC
  Administered 2019-12-04 – 2019-12-07 (×7): 15 mL via OROMUCOSAL

## 2019-12-04 MED ORDER — FENTANYL BOLUS VIA INFUSION
50.0000 ug | INTRAVENOUS | Status: DC | PRN
  Filled 2019-12-04: qty 50

## 2019-12-04 MED ORDER — PROPOFOL 500 MG/50ML IV EMUL
INTRAVENOUS | Status: AC
  Filled 2019-12-04: qty 50

## 2019-12-04 MED ORDER — PERFLUTREN LIPID MICROSPHERE
1.0000 mL | INTRAVENOUS | Status: AC | PRN
  Administered 2019-12-04: 3 mL via INTRAVENOUS
  Filled 2019-12-04: qty 10

## 2019-12-04 MED ORDER — ATROPINE SULFATE 1 MG/10ML IJ SOSY
PREFILLED_SYRINGE | INTRAMUSCULAR | Status: AC
  Filled 2019-12-04: qty 10

## 2019-12-04 MED ORDER — DEXTROSE 50 % IV SOLN: 12.5000 g | INTRAVENOUS | Status: AC

## 2019-12-04 MED ORDER — AMIODARONE HCL IN DEXTROSE 360-4.14 MG/200ML-% IV SOLN
30.0000 mg/h | INTRAVENOUS | Status: DC
  Administered 2019-12-04 – 2019-12-05 (×3): 30 mg/h via INTRAVENOUS
  Filled 2019-12-04 (×3): qty 200

## 2019-12-04 MED ORDER — ORAL CARE MOUTH RINSE
15.0000 mL | OROMUCOSAL | Status: DC
  Administered 2019-12-04 – 2019-12-08 (×35): 15 mL via OROMUCOSAL

## 2019-12-04 MED ORDER — EPINEPHRINE 1 MG/10ML IJ SOSY
1.0000 | PREFILLED_SYRINGE | Freq: Once | INTRAMUSCULAR | Status: AC
  Administered 2019-12-04: 1 mg via INTRAVENOUS

## 2019-12-04 MED ORDER — ETOMIDATE 2 MG/ML IV SOLN
INTRAVENOUS | Status: AC
  Filled 2019-12-04: qty 20

## 2019-12-04 MED ORDER — FENTANYL CITRATE (PF) 100 MCG/2ML IJ SOLN
INTRAMUSCULAR | Status: AC
  Administered 2019-12-04: 100 ug
  Filled 2019-12-04: qty 2

## 2019-12-04 MED ORDER — FENTANYL CITRATE (PF) 100 MCG/2ML IJ SOLN: 50.0000 ug | Freq: Once | INTRAMUSCULAR | Status: DC

## 2019-12-04 MED ORDER — DOCUSATE SODIUM 50 MG/5ML PO LIQD
100.0000 mg | Freq: Two times a day (BID) | ORAL | Status: DC
  Administered 2019-12-04 – 2019-12-07 (×2): 100 mg via ORAL
  Filled 2019-12-04 (×3): qty 10

## 2019-12-04 MED ORDER — IOHEXOL 9 MG/ML PO SOLN
ORAL | Status: AC
  Administered 2019-12-04: 500 mL via ORAL
  Filled 2019-12-04: qty 500

## 2019-12-04 MED ORDER — PHENYLEPHRINE CONCENTRATED 100MG/250ML (0.4 MG/ML) INFUSION SIMPLE
0.0000 ug/min | INTRAVENOUS | Status: DC
  Administered 2019-12-04: 400 ug/min via INTRAVENOUS
  Administered 2019-12-05: 380 ug/min via INTRAVENOUS
  Administered 2019-12-05: 320 ug/min via INTRAVENOUS
  Administered 2019-12-05 (×2): 400 ug/min via INTRAVENOUS
  Administered 2019-12-06: 290 ug/min via INTRAVENOUS
  Administered 2019-12-06: 260 ug/min via INTRAVENOUS
  Administered 2019-12-07: 70 ug/min via INTRAVENOUS
  Administered 2019-12-07 – 2019-12-08 (×2): 400 ug/min via INTRAVENOUS
  Filled 2019-12-04 (×11): qty 250

## 2019-12-04 MED ORDER — SODIUM CHLORIDE (PF) 0.9 % IJ SOLN
INTRAMUSCULAR | Status: AC
  Filled 2019-12-04: qty 50

## 2019-12-04 MED ORDER — ROCURONIUM BROMIDE 10 MG/ML (PF) SYRINGE
PREFILLED_SYRINGE | INTRAVENOUS | Status: AC
  Filled 2019-12-04: qty 10

## 2019-12-04 MED ORDER — AMIODARONE HCL IN DEXTROSE 360-4.14 MG/200ML-% IV SOLN
INTRAVENOUS | Status: AC
  Filled 2019-12-04: qty 200

## 2019-12-04 MED ORDER — EPINEPHRINE 1 MG/10ML IJ SOSY
0.1000 mg | PREFILLED_SYRINGE | INTRAMUSCULAR | Status: DC | PRN
  Administered 2019-12-04 – 2019-12-07 (×34): 0.1 mg via INTRAVENOUS
  Filled 2019-12-04 (×3): qty 10

## 2019-12-04 MED ORDER — SODIUM CHLORIDE 0.9 % IV SOLN
0.0000 ug/min | INTRAVENOUS | Status: DC
  Administered 2019-12-04 (×2): 400 ug/min via INTRAVENOUS
  Filled 2019-12-04 (×5): qty 10

## 2019-12-04 MED ORDER — SODIUM CHLORIDE 0.9 % IV SOLN
1.0000 g | Freq: Once | INTRAVENOUS | Status: AC
  Administered 2019-12-04: 1 g via INTRAVENOUS
  Filled 2019-12-04: qty 10

## 2019-12-04 MED ORDER — FENTANYL 2500MCG IN NS 250ML (10MCG/ML) PREMIX INFUSION: 50.0000 ug/h | INTRAVENOUS | Status: DC

## 2019-12-04 MED ORDER — MIDAZOLAM HCL 2 MG/2ML IJ SOLN
INTRAMUSCULAR | Status: AC
  Administered 2019-12-04: 2 mg
  Filled 2019-12-04: qty 4

## 2019-12-04 MED ORDER — VASOPRESSIN 20 UNIT/ML IV SOLN
0.0400 [IU]/min | INTRAVENOUS | Status: DC
  Administered 2019-12-04 – 2019-12-05 (×3): 0.05 [IU]/min via INTRAVENOUS
  Administered 2019-12-06: 0.04 [IU]/min via INTRAVENOUS
  Administered 2019-12-06: 0.05 [IU]/min via INTRAVENOUS
  Administered 2019-12-07: 0.04 [IU]/min via INTRAVENOUS
  Filled 2019-12-04 (×8): qty 2

## 2019-12-04 MED ORDER — FENTANYL CITRATE (PF) 100 MCG/2ML IJ SOLN
50.0000 ug | INTRAMUSCULAR | Status: DC | PRN
  Administered 2019-12-06 (×2): 100 ug via INTRAVENOUS
  Filled 2019-12-04 (×2): qty 2

## 2019-12-04 MED ORDER — PHENYLEPHRINE CONCENTRATED 100MG/250ML (0.4 MG/ML) INFUSION SIMPLE
0.0000 ug/min | INTRAVENOUS | Status: DC
  Filled 2019-12-04 (×2): qty 250

## 2019-12-04 MED ORDER — MIDAZOLAM HCL 2 MG/2ML IJ SOLN
2.0000 mg | INTRAMUSCULAR | Status: DC | PRN
  Administered 2019-12-04: 0.5 mg via INTRAVENOUS
  Administered 2019-12-05 – 2019-12-06 (×2): 2 mg via INTRAVENOUS
  Filled 2019-12-04: qty 2

## 2019-12-04 NOTE — Progress Notes (Signed)
CRITICAL VALUE ALERT  Critical Value:  Lactic Acid Greater than 11  Date & Time Notied:  12/04/19  Provider Notified: Chestine Spore, L. MD PCCM  Orders Received/Actions taken:

## 2019-12-04 NOTE — Progress Notes (Signed)
O2 demands and vasopressor demands increasing over the afternoon. Patient newly having occasional gasping breaths but patient remains oriented. MD paged to notify. Patient placed on bed pan and during turning patient had vagal episode. After patient turned back he had new complaints of vision loss. Tachycardia, tachypnea, and hypotension worsening. MD brought to bedside and decision made to intubate. Patients uncle had just visited and unable locate or reach via phone. Before intubation patient had bradycardia episode in the 50s, 0.5 mg Epi push given. HR tachycardic 200s and BP responded well. HR dropping as low as 50s then back into 180s. Patient intubated, OG tube placed. Post intubation patient experiencing significant hypotension. 0.1 epi pushed multiple times to equal 4 mg while awaiting epinephrine drip. 3 amps total of Sodium Bicarb pushed, as well as 1 amp of calcium chloride. Lab work drawn, additional access obtained, CRRT stopped and blood returned.  MD remained at bedside throughout event. Multiple verbal orders received.   Event Start Time: 1240 Intubation Time: 1255 Event End Time: 1435

## 2019-12-04 NOTE — Plan of Care (Signed)
Developing new arrhythmias this morning, dropping BP. He was having changes in his vision with MAPs in the low 50s. Increasing tachypnea and starting to become more lethargic again.   Decision made to intubate emergently. CT abd/ pelvis ordered to evaluate uncontrolled source of sepsis. Discussed with nephrology, who agrees with the necessity of the scan.  Fentanyl & versed for sedation. Very hemodynamically sensitive to sedation, requiring frequent boluses of 0.1mg  epi. Waking up, attempting to self extubate after sedation wears off.  I called and updated his mother on these changes. She is driving back from [place] today.  Additional 25 minutes critical care time.  Steffanie Dunn, DO 12/04/19 1:28 PM [facility]

## 2019-12-04 NOTE — Progress Notes (Signed)
Patient noted to be having new onset dropped beats vs. Pauses, regularly on telemetry monitor. No severe electrolyte imbalances noted on AM labs outside of elevated Mag. 12 lead obtained showing sinus arrhythmia and atrial enlargement. MD notified. Labs drawn. Echo complete.

## 2019-12-04 NOTE — Plan of Care (Signed)
Remains HD unstable. CRRT rinsed back, maxed+ on epi, neo, levo, vaso, and Ang II drips. Bicarb, calcium, epinephrine pushes given to help stabilize severe hypotension. Bolusing D5W-0.45NS. Currently holding all sedation, only periodically moving arms.  Amiodarone started without bolus to stabilize tachycardia.  INR 2. 2 units FFP ordered. Verbally consented for blood product transfusion with his mother Faith over the phone.   Appreciate nephrology's input. Running bicarb peripherally. Will restart as soon as he is stable enough. Will go to CT as soon as he is stable enough to go.  Additional 45 minutes of bedside CC.   Steffanie Dunn, DO 12/04/19 2:31 PM [facility]

## 2019-12-04 NOTE — Progress Notes (Signed)
eLink Physician-Brief Progress Note Patient Name: Edward Woods DOB: [birth year] MRN: [id number]   Date of Service  12/04/2019  HPI/Events of Note  Blood glucose = 67 on insulin IV infusion. Anion Gap = 20.   eICU Interventions  Plan: 1. Increase D5 0.45 NaCl IV infusion rate to 100 mL/hour. 2. Continue insulin IV infusion until Anion Gap closes.      Intervention Category Major Interventions: Hyperglycemia - active titration of insulin therapy  Sommer,Steven Eugene 12/04/2019, 6:05 AM

## 2019-12-04 NOTE — Procedures (Signed)
Orogastric tube placed under direct visualization using glidescope. Tube confirmed not passing through VC.  + sounds over epigastrum with flushing.  KUB pending.  Steffanie Dunn, DO 12/04/19 1:22 PM [facility]

## 2019-12-04 NOTE — Progress Notes (Signed)
eLink Physician-Brief Progress Note Patient Name: Edward Woods DOB: February 26, 1991 MRN: [id number]   Date of Service  12/04/2019  HPI/Events of Note  Notified of hypotension despite multiple maxed out pressors. On no sedation. Ongoing CRRT net positive, on bicarbonate drip  eICU Interventions  Will get ABG and reassess acid base status. May need additional bicarb     Intervention Category Major Interventions: Hypotension - evaluation and management  Darl Pikes 12/04/2019, 11:50 PM

## 2019-12-04 NOTE — Progress Notes (Signed)
[facility] Progress Note    Assessment/ Plan:   1.  AKI: acute oliguric AKi--> continue CRRT.  All 4K bath.  isotonic bicarb gtt in post fluids.   No heparin.    2.  Profound shock: acute decompensation.  Increase lactate and pressor requirement.  Ct abd/ pelvis with contrast necessary.  On 5 pressors, getting epi pushes. TTE with EF 60-65% and no sig RV dysfunction.  ? Pseudocyst.  Antibiotics on board,  Blood cultures 5/13 1/2 CONS.  On vanc/ meropenem.  Hgb has been OK.  Stress dose steroids.    3.  Pancreatitis: CT abd/ pelvis showing pancreatitis, lack of IV contrast difficult to determine necrosis or not. Abd [place] with fatty liver.  TG 1100--> 531.  See #2 above.  Aggressive electrolyte repletion.  4.  DKA: on insulin gtt  5.  Acute respiratory failure: intubated 5/16  6.  Acute encephalopathy: CT head L frontal periventricular white matter.  Per primary  7.  Dispo:  ICU  Subjective:    Acute decompensation.  Arrhythmias, increased lactate and pressors.  Intubated.  Profound shock.  Going for CT scan.  D/w PCCM- OK to use contrast for any scans necessary.    Objective:   BP (!) 62/47   Pulse 91   Temp (!) 97.1 F (36.2 C) (Axillary)   Resp (!) 52   Ht 6' (1.829 m)   Wt (!) 163.2 kg   SpO2 92%   BMI 48.80 kg/m   Intake/Output Summary (Last 24 hours) at 12/04/2019 1357 Last data filed at 12/04/2019 1300 Gross per 24 hour  Intake 5449.3 ml  Output 5507 ml  Net -57.7 ml   Weight change: -1 kg  Physical Exam: GEN: intubated, ill HEENT eyes closed NECK  No JVD PULM tachypneic CV tachycardic ABD soft, somewhat distended EXT trace edema NEURO obtunded SKIN no rashes ACCESS: R IJ nontunneled HD cath  Imaging: ECHOCARDIOGRAM COMPLETE  Result Date: 12/04/2019    ECHOCARDIOGRAM REPORT   Patient Name:   Edward Woods Date of Exam: 12/04/2019 Medical Rec #:  [id number]           Height:       72.0 in Accession #:    [id number]          Weight:        359.8 lb Date of Birth:  1990-11-30            BSA:          2.734 m Patient Age:    29 years            BP:           62/47 mmHg Patient Gender: M                   HR:           96 bpm. Exam Location:  Inpatient Procedure: 2D Echo Indications:    Shock  History:        Patient has no prior history of Echocardiogram examinations.                 Risk Factors:Diabetes.  Sonographer:    Mikki Santee RDCS (AE) Referring Phys: [id number] Julian Hy  Sonographer Comments: Patient is morbidly obese, suboptimal apical window and suboptimal subcostal window. IMPRESSIONS  1. Left ventricular ejection fraction, by estimation, is 60 to 65%. The left ventricle has normal function. The left ventricle has no regional wall motion abnormalities.  There is mild left ventricular hypertrophy. Left ventricular diastolic parameters are indeterminate.  2. Right ventricular systolic function is moderately reduced. The right ventricular size is normal.  3. The mitral valve is grossly normal. Trivial mitral valve regurgitation.  4. The aortic valve is tricuspid. Aortic valve regurgitation is not visualized. FINDINGS  Left Ventricle: Left ventricular ejection fraction, by estimation, is 60 to 65%. The left ventricle has normal function. The left ventricle has no regional wall motion abnormalities. Definity contrast agent was given IV to delineate the left ventricular  endocardial borders. The left ventricular internal cavity size was normal in size. There is mild left ventricular hypertrophy. Left ventricular diastolic parameters are indeterminate. Right Ventricle: The right ventricular size is normal. No increase in right ventricular wall thickness. Right ventricular systolic function is moderately reduced. Left Atrium: Left atrial size was normal in size. Right Atrium: Right atrial size was normal in size. Pericardium: There is no evidence of pericardial effusion. Mitral Valve: The mitral valve is grossly normal. Trivial mitral  valve regurgitation. Tricuspid Valve: The tricuspid valve is grossly normal. Tricuspid valve regurgitation is trivial. Aortic Valve: The aortic valve is tricuspid. Aortic valve regurgitation is not visualized. Pulmonic Valve: The pulmonic valve was not well visualized. Pulmonic valve regurgitation is trivial. Aorta: The aortic root is normal in size and structure. Venous: The inferior vena cava was not well visualized. IAS/Shunts: The interatrial septum was not well visualized.  LEFT VENTRICLE PLAX 2D LVIDd:         3.70 cm LVIDs:         2.60 cm LV PW:         1.20 cm LV IVS:        1.20 cm LVOT diam:     2.00 cm LV SV:         36 LV SV Index:   13 LVOT Area:     3.14 cm  RIGHT VENTRICLE RV S prime:     19.40 cm/s TAPSE (M-mode): 1.5 cm LEFT ATRIUM             Index      RIGHT ATRIUM          Index LA diam:        2.50 cm 0.91 cm/m RA Area:     6.76 cm LA Vol (A2C):   10.4 ml 3.80 ml/m RA Volume:   9.60 ml  3.51 ml/m LA Vol (A4C):   17.9 ml 6.55 ml/m LA Biplane Vol: 14.2 ml 5.19 ml/m  AORTIC VALVE LVOT Vmax:   61.70 cm/s LVOT Vmean:  41.600 cm/s LVOT VTI:    0.116 m  AORTA Ao Root diam: 2.70 cm  SHUNTS Systemic VTI:  0.12 m Systemic Diam: 2.00 cm Nona Dell MD Electronically signed by Nona Dell MD Signature Date/Time: 12/04/2019/11:21:07 AM    Final     Labs: BMET Recent Labs  Lab 12/12/19 1156 12-12-2019 1652 12-12-2019 2147 12/02/19 0202 12/02/19 1400 12/02/19 1600 12/03/19 0256 12/03/19 1407 12/03/19 1500 12/04/19 0430 12/04/19 1119  NA 150*   < > 151*   < > 148* 148* 141  141 141 143 139 136  K 4.0   < > 4.4   < > 4.4 4.3 4.3  4.4 4.4 4.4 4.7 4.7  CL 115*   < > 120*   < > 114* 114* 113*  113* 106 108 98 98  CO2 11*   < > 13*   < > 18* 19* 16*  17* 17*  17* 21* 14*  GLUCOSE 519*   < > 415*   < > 228* 228* 177*  179* 206* 203* 169* 177*  BUN 31*   < > 39*   < > 39* 39* 33*  33* 29* 30* 27* 26*  CREATININE 2.33*   < > 4.56*   < > 5.96* 5.92* 5.32*  5.46* 4.35* 4.32* 3.75*  3.61*  CALCIUM 8.6*   < > 8.7*   < > 7.4* 7.4* 7.1*  7.1* 6.7* 6.8* 7.0* 6.7*  PHOS 2.8  --  2.1*  --   --  1.6* 2.6  --  5.2* 6.3*  --    < > = values in this interval not displayed.   CBC Recent Labs  Lab 2019/12/16 0641 Dec 16, 2019 0641 16-Dec-2019 1156 12/02/19 0443 12/03/19 1407 12/04/19 0430  WBC 20.5*   < > 18.9* 14.7* 14.9* 17.8*  NEUTROABS 16.7*  --   --   --   --   --   HGB 18.6*   < > 17.8* 17.4* 14.4 14.7  HCT 57.4*   < > 54.9* 52.3* 43.4 44.6  MCV 86.7   < > 86.9 84.6 85.6 85.6  PLT 540*   < > 462* 275 91* 95*   < > = values in this interval not displayed.    Medications:    . Chlorhexidine Gluconate Cloth  6 each Topical Daily  . docusate  100 mg Oral BID  . etomidate      . fentaNYL (SUBLIMAZE) injection  50 mcg Intravenous Once  . heparin injection (subcutaneous)  5,000 Units Subcutaneous Q8H  . hydrocortisone sod succinate (SOLU-CORTEF) inj  50 mg Intravenous Q6H  . mouth rinse  15 mL Mouth Rinse BID  . pantoprazole (PROTONIX) IV  40 mg Intravenous Q24H  . polyethylene glycol  17 g Oral Daily  . rocuronium bromide          Bufford Buttner MD 12/04/2019, 1:57 PM

## 2019-12-04 NOTE — Progress Notes (Signed)
  Echocardiogram 2D Echocardiogram has been performed.  Edward Woods 12/04/2019, 10:54 AM

## 2019-12-04 NOTE — Progress Notes (Signed)
CRITICAL VALUE ALERT  Critical Value:  PH 6.966  Date & Time Notied:  12/04/19, 1533  Provider Notified: Caprice Red DO, PCCM  Orders Received/Actions taken: 2 amps Bicarb given now

## 2019-12-04 NOTE — Progress Notes (Signed)
NAME:  Edward Woods, MRN:  [id number], DOB:  [birth year], LOS: 3 ADMISSION DATE:  08-Dec-2019, CONSULTATION DATE:  5/13 REFERRING MD:  Jonathon Bellows, CHIEF COMPLAINT: Diabetic ketoacidosis with acute metabolic encephalopathy  Brief History    29 year old male who presented to the emergency room on 5/13 with chief complaint of weakness, fatigue and malaise. Also reporting worsening abdominal discomfort and approximately 2 to 3 days of progressive shortness of breath with diffuse chest discomfort.  He had had nausea vomiting, had not been able to keep any foods down.  Denied being around any sick exposures, no fever chills cough hemoptysis diarrhea or constipation.  In the emergency room he was found to be tachycardic, normotensive, tachypneic, serum bicarbonate 9, glucose 908, creatinine 2.78, anion gap 30, mild elevated ALT of 50, lipase 342, white blood cell count 20.5 with hemoglobin of 18.6, serum lactate of 3.9 beta hydroxybutyric acid was greater than 8.  He was to be admitted with a working diagnosis of acute diabetic ketoacidosis with resultant acute metabolic encephalopathy.  IV fluids were administered, he received 4 L of crystalloid resuscitation, IV insulin was started, lactic acid still remain elevated in spite of fluid resuscitation.  Because of profound metabolic derangements and altered sensorium critical care was asked to assist with care and evaluate.  Past Medical History   No known medical history Significant Hospital Events   5/13 admitted with working diagnosis of DKA, administered IV fluids, insulin started . Awake, does not off quickly, but now oriented x3.  Nursing reports remarkably improved level of consciousness since admission  Consults:  Critical care consulted 5/13  Procedures:  HD catheter 5/14 A-line 5/14  Significant Diagnostic Tests:  Beta hydroxybutyric acid was greater than 8 Lipase was 342   Micro Data:  BCX2 5/13>>> CONS 2/4  Antimicrobials:   5/13 -  Zosyn 5/14 -Merrem >> 5/14 vanc>>   Interim history/subjective:  Hypoglycemic overnight with D5 half-normal rate increased.   Objective   Blood pressure (!) 62/47, pulse 98, temperature (S) 98.6 F (37 C), temperature source Axillary, resp. rate (!) 32, height 6' (1.829 m), weight (!) 163.2 kg, SpO2 (!) 88 %.     Intake/Output Summary (Last 24 hours) at 12/04/2019 0817 Last data filed at 12/04/2019 0700 Gross per 24 hour  Intake 4212.26 ml  Output 4407 ml  Net -194.74 ml   Filed Weights   12/02/19 0830 12/03/19 0417 12/04/19 0433  Weight: (!) (P) 166.9 kg (!) 164.2 kg (!) 163.2 kg    General appearance: critically- ill appearing man laying in bed in NAD HEENT: [place]/AT, eyes anicteric, dry lips. Neck: Right IJ dialysis catheter left IJ TLC Lungs: Tachypnea improved, breathing comfortably on 5 L nasal cannula.  Clear to auscultation bilaterally. Heart: Regular rate and rhythm, no murmurs Abdomen: Obese, distended, soft, tender to palpation Extremities: Mild distal extremities, feet cool but hands warm. Skin: No rashes Neurologic: Sleepy, but arouses to verbal stimulation.  Able to speak in full sentences.  CAM- ICU positive.  Moving all extremities.     Resolved Hospital Problem list     Assessment & Plan:  Acute metabolic encephalopathy worsened with hypotension -Remains high intubation risk. -Maintain MAP greater than 65 -Checking ammonia today  DKA with lactic acidosis- HgbA1C 11.6 at admission. Triggered by acute pancreatitis due to hypertriglyceridemia Now hypoglycemic -Continue insulin infusion at this time.  Hypoglycemia does not correlate with lab draws.  Question if peripheral Accu-Cheks are accurate. 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 BISAP score 3 -Continue supportive care -Remain n.p.o. -Continue to monitor calcium  AKI/ATN with anuria -Continue CRRT -Continue post filter  bicarb; had been worsening with this discontinued -Appreciate nephrology's assistance  Acute liver injury with hyperbilirubinemia; no evidence of gallstones admission CT scan. Septic shock likely explains acute liver injury. -Checking INR, ammonia -Continue to monitor -Discontinue acetaminophen PRN   Septic shock -worsening  -Continue vasopressors-norepinephrine, vasopressin, angiotensin II to maintain MAP greater than 65. Adding neosynephrine. -Continue meropenem and vancomycin for source control -Echocardiogram ordered -Continue stress dose steroids -Needs CT scan, ideally with contrast to evaluate for possible source of infection.  Will discuss with nephrology.  Hypocalcemia 2/2 pancreatitis & hyperphosphatemia -Continue to monitor.  Will avoid aggressively repleting given calcium phosphate deposition  Hyperphosphatemia due to renal failure -Continue to monitor -Avoid p.o. intake -Phos binders when able to take p.o.  Positive blood cultures- CONS in 1/2 sites -Likely contaminant, but repeat cultures pending -Continue empiric antibiotics  Thrombocytopenia likely due to acute illness, less likely antibiotics. 4T score=2, low probability. -Continue to monitor for signs of bleeding -No indication for transfusion currently   Best practice:  Diet: N.p.o.  Pain/Anxiety/Delirium protocol (if indicated):  VAP protocol (if indicated): Not indicated DVT prophylaxis: [place] heparin GI prophylaxis: PPI Glucose control: Phase 2 Mobility: Bedrest for now Code Status: Full code Family Communication: updated patient, who requested that his parents not be updated while they are out of town today. Mother Joseeduardo Brix emergency contact. Disposition: ICU    LABS    PULMONARY Recent Labs  Lab 12/07/19 1017 12-07-19 1942 12-07-19 2148 12/02/19 1603 12/03/19 1250  PHART  --  7.258*  --   --  7.282*  PCO2ART  --  28.7*  --   --  31.4*  PO2ART  --  83.6  --   --  113*  HCO3 8.5*  12.4* 12.3* 15.6* 14.3*  O2SAT 76.3 95.3 70.8 76.8 97.2    CBC Recent Labs  Lab 12/02/19 0443 12/03/19 1407 12/04/19 0430  HGB 17.4* 14.4 14.7  HCT 52.3* 43.4 44.6  WBC 14.7* 14.9* 17.8*  PLT 275 91* 95*    COAGULATION No results for input(s): INR in the last 168 hours.  CARDIAC  No results for input(s): TROPONINI in the last 168 hours. No results for input(s): PROBNP in the last 168 hours.   CHEMISTRY Recent Labs  Lab 12-07-2019 1156 12-07-19 1652 12/07/19 2147 12/02/19 0202 12/02/19 1600 12/02/19 1600 12/03/19 0256 12/03/19 0256 12/03/19 1407 12/03/19 1407 12/03/19 1500 12/04/19 0430  NA 150*   < > 151*   < > 148*  --  141  141  --  141  --  143 139  K 4.0   < > 4.4   < > 4.3   < > 4.3  4.4   < > 4.4   < > 4.4 4.7  CL 115*   < > 120*   < > 114*  --  113*  113*  --  106  --  108 98  CO2 11*   < > 13*   < > 19*  --  16*  17*  --  17*  --  17* 21*  GLUCOSE 519*   < > 415*   < > 228*  --  177*  179*  --  206*  --  203* 169*  BUN 31*   < > 39*   < > 39*  --  33*  33*  --  29*  --  30* 27*  CREATININE 2.33*   < > 4.56*   < > 5.92*  --  5.32*  5.46*  --  4.35*  --  4.32* 3.75*  CALCIUM 8.6*   < > 8.7*   < > 7.4*  --  7.1*  7.1*  --  6.7*  --  6.8* 7.0*  MG 3.1*  --  2.7*  --   --   --  2.4  --   --   --   --  2.6*  PHOS 2.8  --  2.1*  --  1.6*  --  2.6  --   --   --  5.2* 6.3*   < > = values in this interval not displayed.   Estimated Creatinine Clearance: 46.8 mL/min (A) (by C-G formula based on SCr of 3.75 mg/dL (H)).   LIVER Recent Labs  Lab 24-Dec-2019 0641 12/24/2019 0641 12/24/19 2147 12/02/19 1600 12/03/19 0256 12/03/19 1500 12/04/19 0430  AST 28  --  36  --   --   --  RESULTS UNAVAILABLE DUE TO INTERFERING SUBSTANCE  ALT 50*  --  35  --   --   --  4,117*  ALKPHOS 97  --  68  --   --   --  145*  BILITOT 2.4*  --  1.4*  --   --   --  2.9*  PROT 9.7*  --  7.2  --   --   --  7.2  ALBUMIN 4.8   < > 3.5 3.0* 2.8* 2.8* 3.0*  3.0*   < > = values in  this interval not displayed.     INFECTIOUS Recent Labs  Lab 12-24-2019 1652 12-24-2019 2148 12/02/19 0202  LATICACIDVEN 5.0* 4.0* 4.4*     ENDOCRINE CBG (last 3)  Recent Labs    12/04/19 0625 12/04/19 0709 12/04/19 0811  GLUCAP 99 138* 89         IMAGING x48h  - image(s) personally visualized  -   highlighted in bold DG Chest Port 1 View  Result Date: 12/02/2019 CLINICAL DATA:  Encounter for central line placement. EXAM: PORTABLE CHEST 1 VIEW COMPARISON:  Chest radiograph Dec 24, 2019 FINDINGS: Interval placement of a right IJ approach central venous catheter with tip projecting in the region of the superior cavoatrial junction. Unchanged position of a left IJ approach central venous catheter with tip projecting in the region of the lower SVC or superior cavoatrial junction. Shallow inspiration radiograph with accentuation of the cardiac silhouette. Heart size likely within normal limits. No appreciable airspace consolidation within the lungs. No evidence of pleural effusion or pneumothorax. No acute bony abnormality. IMPRESSION: Interval placement of a right IJ approach central venous catheter with tip projecting in the region of the superior cavoatrial junction. Unchanged position of a left IJ approach central venous catheter with tip in the lower SVC or superior cavoatrial junction. Shallow inspiration radiograph. No evidence of acute cardiopulmonary abnormality. Electronically Signed   By: Kellie Simmering DO   On: 12/02/2019 10:12     This patient is critically ill with multiple organ system failure which requires frequent high complexity decision making, assessment, support, evaluation, and titration of therapies. This was completed through the application of advanced monitoring technologies and extensive interpretation of multiple databases. During this encounter critical care time was devoted to patient care services described in this note for 50 minutes.  Julian Hy, DO  12/04/19 8:17 AM  [facility]

## 2019-12-04 NOTE — Care Plan (Signed)
CT with severe pancreatitis but no abscesses or drainable sources of infection. RLL pneumonia with tiny dependent effusion incidentally noted on CT, possibly his source of acutely worsening septic shock today. Con't to cover broadly with meropenem and vancomycin. Con't 5 vasopressors. Resuming CRRT. Pending labs: LA, CBC, CMP.  Steffanie Dunn, DO 12/04/19 4:49 PM [facility]

## 2019-12-04 NOTE — Procedures (Signed)
Intubation Procedure Note Edward Woods [id number] February 01, 1991  Procedure: Intubation Indications: Airway protection and maintenance  Procedure Details Consent: Risks of procedure as well as the alternatives and risks of each were explained to the (patient/caregiver).  Consent for procedure obtained. Time Out: Verified patient identification, verified procedure, site/side was marked, verified correct patient position, special equipment/implants available, medications/allergies/relevent history reviewed, required imaging and test results available.  Performed  Maximum sterile technique was used including antiseptics, cap, gloves, hand hygiene and mask.  MAC and 4 Glidescope  S4 1 attempt, grade 1 view   Versed 81m IV Succinylcholine 1058mIV Fentanyl 2541m    Evaluation Hemodynamic Status: Transient hypotension treated with pressors; O2 sats: transiently fell during during procedure Patient's Current Condition: unstable Complications: No apparent complications Patient did tolerate procedure well. Chest X-ray ordered to verify placement.  CXR: pending.   [name]16/2021

## 2019-12-05 ENCOUNTER — Inpatient Hospital Stay (HOSPITAL_COMMUNITY): Payer: Medicaid Other

## 2019-12-05 DIAGNOSIS — K859 Acute pancreatitis without necrosis or infection, unspecified: Secondary | ICD-10-CM

## 2019-12-05 LAB — RENAL FUNCTION PANEL
Albumin: 2.5 g/dL — ABNORMAL LOW (ref 3.5–5.0)
Albumin: 2.8 g/dL — ABNORMAL LOW (ref 3.5–5.0)
Anion gap: 32 — ABNORMAL HIGH (ref 5–15)
Anion gap: 20 — ABNORMAL HIGH (ref 5–15)
BUN: 20 mg/dL (ref 6–20)
BUN: 18 mg/dL (ref 6–20)
CO2: 10 mmol/L — ABNORMAL LOW (ref 22–32)
CO2: 21 mmol/L — ABNORMAL LOW (ref 22–32)
Calcium: 6 mg/dL — CL (ref 8.9–10.3)
Calcium: 5.8 mg/dL — CL (ref 8.9–10.3)
Chloride: 99 mmol/L (ref 98–111)
Chloride: 95 mmol/L — ABNORMAL LOW (ref 98–111)
Creatinine, Ser: 3.04 mg/dL — ABNORMAL HIGH (ref 0.61–1.24)
Creatinine, Ser: 2.48 mg/dL — ABNORMAL HIGH (ref 0.61–1.24)
GFR calc Af Amer: 31 mL/min — ABNORMAL LOW (ref >60)
GFR calc Af Amer: 40 mL/min — ABNORMAL LOW (ref >60)
GFR calc non Af Amer: 27 mL/min — ABNORMAL LOW (ref >60)
GFR calc non Af Amer: 34 mL/min — ABNORMAL LOW (ref >60)
Glucose, Bld: 221 mg/dL — ABNORMAL HIGH (ref 70–99)
Glucose, Bld: 140 mg/dL — ABNORMAL HIGH (ref 70–99)
Phosphorus: 8.3 mg/dL — ABNORMAL HIGH (ref 2.5–4.6)
Phosphorus: 5.6 mg/dL — ABNORMAL HIGH (ref 2.5–4.6)
Potassium: 4.4 mmol/L (ref 3.5–5.1)
Potassium: 4.1 mmol/L (ref 3.5–5.1)
Sodium: 141 mmol/L (ref 135–145)
Sodium: 136 mmol/L (ref 135–145)

## 2019-12-05 LAB — BLOOD GAS, ARTERIAL
Acid-base deficit: 21 mmol/L — ABNORMAL HIGH (ref 0.0–2.0)
Acid-base deficit: 3.3 mmol/L — ABNORMAL HIGH (ref 0.0–2.0)
Acid-base deficit: 4.9 mmol/L — ABNORMAL HIGH (ref 0.0–2.0)
Acid-base deficit: 5.6 mmol/L — ABNORMAL HIGH (ref 0.0–2.0)
Bicarbonate: 18.7 mmol/L — ABNORMAL LOW (ref 20.0–28.0)
Bicarbonate: 19.3 mmol/L — ABNORMAL LOW (ref 20.0–28.0)
Bicarbonate: 21.1 mmol/L (ref 20.0–28.0)
Bicarbonate: 7 mmol/L — ABNORMAL LOW (ref 20.0–28.0)
Drawn by: 295031
Drawn by: 295031
Drawn by: 295031
FIO2: 0.5
FIO2: 50
FIO2: 50
MECHVT: 620 mL
MECHVT: 540 mL
MECHVT: 470 mL
O2 Saturation: 91.8 %
O2 Saturation: 93.2 %
O2 Saturation: 94.4 %
O2 Saturation: 95.9 %
PEEP: 8 cmH2O
PEEP: 8 cmH2O
PIP: 8 cmH2O
Patient temperature: 95.6
Patient temperature: 98.6
Patient temperature: 98.6
Patient temperature: 98.6
RATE: 30 resp/min
RATE: 36 resp/min
RATE: 38 resp/min
pCO2 arterial: 20.6 mmHg — ABNORMAL LOW (ref 32.0–48.0)
pCO2 arterial: 34.1 mmHg (ref 32.0–48.0)
pCO2 arterial: 34.3 mmHg (ref 32.0–48.0)
pCO2 arterial: 37.5 mmHg (ref 32.0–48.0)
pH, Arterial: 7.143 — CL (ref 7.350–7.450)
pH, Arterial: 7.358 (ref 7.350–7.450)
pH, Arterial: 7.368 (ref 7.350–7.450)
pH, Arterial: 7.368 (ref 7.350–7.450)
pO2, Arterial: 110 mmHg — ABNORMAL HIGH (ref 83.0–108.0)
pO2, Arterial: 74 mmHg — ABNORMAL LOW (ref 83.0–108.0)
pO2, Arterial: 80.4 mmHg — ABNORMAL LOW (ref 83.0–108.0)
pO2, Arterial: 84.9 mmHg (ref 83.0–108.0)

## 2019-12-05 LAB — GLUCOSE, CAPILLARY
Glucose-Capillary: 101 mg/dL — ABNORMAL HIGH (ref 70–99)
Glucose-Capillary: 116 mg/dL — ABNORMAL HIGH (ref 70–99)
Glucose-Capillary: 117 mg/dL — ABNORMAL HIGH (ref 70–99)
Glucose-Capillary: 126 mg/dL — ABNORMAL HIGH (ref 70–99)
Glucose-Capillary: 138 mg/dL — ABNORMAL HIGH (ref 70–99)
Glucose-Capillary: 166 mg/dL — ABNORMAL HIGH (ref 70–99)
Glucose-Capillary: 167 mg/dL — ABNORMAL HIGH (ref 70–99)
Glucose-Capillary: 179 mg/dL — ABNORMAL HIGH (ref 70–99)
Glucose-Capillary: 179 mg/dL — ABNORMAL HIGH (ref 70–99)
Glucose-Capillary: 182 mg/dL — ABNORMAL HIGH (ref 70–99)
Glucose-Capillary: 182 mg/dL — ABNORMAL HIGH (ref 70–99)
Glucose-Capillary: 192 mg/dL — ABNORMAL HIGH (ref 70–99)
Glucose-Capillary: 195 mg/dL — ABNORMAL HIGH (ref 70–99)
Glucose-Capillary: 197 mg/dL — ABNORMAL HIGH (ref 70–99)
Glucose-Capillary: 203 mg/dL — ABNORMAL HIGH (ref 70–99)
Glucose-Capillary: 209 mg/dL — ABNORMAL HIGH (ref 70–99)
Glucose-Capillary: 210 mg/dL — ABNORMAL HIGH (ref 70–99)
Glucose-Capillary: 212 mg/dL — ABNORMAL HIGH (ref 70–99)
Glucose-Capillary: 214 mg/dL — ABNORMAL HIGH (ref 70–99)
Glucose-Capillary: 216 mg/dL — ABNORMAL HIGH (ref 70–99)
Glucose-Capillary: 96 mg/dL (ref 70–99)
Glucose-Capillary: 99 mg/dL (ref 70–99)

## 2019-12-05 LAB — CBC WITH DIFFERENTIAL/PLATELET
Abs Immature Granulocytes: 7.96 10*3/uL — ABNORMAL HIGH (ref 0.00–0.07)
Basophils Absolute: 0.1 10*3/uL (ref 0.0–0.1)
Basophils Relative: 0 %
Eosinophils Absolute: 0 10*3/uL (ref 0.0–0.5)
Eosinophils Relative: 0 %
HCT: 30.5 % — ABNORMAL LOW (ref 39.0–52.0)
Hemoglobin: 10 g/dL — ABNORMAL LOW (ref 13.0–17.0)
Immature Granulocytes: 28 %
Lymphocytes Relative: 11 %
Lymphs Abs: 3.2 10*3/uL (ref 0.7–4.0)
MCH: 28.3 pg (ref 26.0–34.0)
MCHC: 32.8 g/dL (ref 30.0–36.0)
MCV: 86.4 fL (ref 80.0–100.0)
Monocytes Absolute: 2.9 10*3/uL — ABNORMAL HIGH (ref 0.1–1.0)
Monocytes Relative: 10 %
Neutro Abs: 14.2 10*3/uL — ABNORMAL HIGH (ref 1.7–7.7)
Neutrophils Relative %: 51 %
Platelets: 39 10*3/uL — ABNORMAL LOW (ref 150–400)
RBC: 3.53 MIL/uL — ABNORMAL LOW (ref 4.22–5.81)
RDW: 16 % — ABNORMAL HIGH (ref 11.5–15.5)
WBC: 28.3 10*3/uL — ABNORMAL HIGH (ref 4.0–10.5)
nRBC: 7.9 % — ABNORMAL HIGH (ref 0.0–0.2)

## 2019-12-05 LAB — CBC
HCT: 33.1 % — ABNORMAL LOW (ref 39.0–52.0)
Hemoglobin: 10.5 g/dL — ABNORMAL LOW (ref 13.0–17.0)
MCH: 28.5 pg (ref 26.0–34.0)
MCHC: 31.7 g/dL (ref 30.0–36.0)
MCV: 89.9 fL (ref 80.0–100.0)
Platelets: 54 10*3/uL — ABNORMAL LOW (ref 150–400)
RBC: 3.68 MIL/uL — ABNORMAL LOW (ref 4.22–5.81)
RDW: 16.6 % — ABNORMAL HIGH (ref 11.5–15.5)
WBC: 30.3 10*3/uL — ABNORMAL HIGH (ref 4.0–10.5)
nRBC: 7.5 % — ABNORMAL HIGH (ref 0.0–0.2)

## 2019-12-05 LAB — BASIC METABOLIC PANEL
Anion gap: 33 — ABNORMAL HIGH (ref 5–15)
BUN: 21 mg/dL — ABNORMAL HIGH (ref 6–20)
CO2: 8 mmol/L — ABNORMAL LOW (ref 22–32)
Calcium: 5.7 mg/dL — CL (ref 8.9–10.3)
Chloride: 98 mmol/L (ref 98–111)
Creatinine, Ser: 3.26 mg/dL — ABNORMAL HIGH (ref 0.61–1.24)
GFR calc Af Amer: 29 mL/min — ABNORMAL LOW (ref >60)
GFR calc non Af Amer: 25 mL/min — ABNORMAL LOW (ref >60)
Glucose, Bld: 231 mg/dL — ABNORMAL HIGH (ref 70–99)
Potassium: 4.5 mmol/L (ref 3.5–5.1)
Sodium: 139 mmol/L (ref 135–145)

## 2019-12-05 LAB — LACTIC ACID, PLASMA
Lactic Acid, Venous: 9.9 mmol/L (ref 0.5–1.9)
Lactic Acid, Venous: >11 mmol/L (ref 0.5–1.9)

## 2019-12-05 LAB — MAGNESIUM: Magnesium: 2.9 mg/dL — ABNORMAL HIGH (ref 1.7–2.4)

## 2019-12-05 LAB — COMPREHENSIVE METABOLIC PANEL
ALT: 6592 U/L — ABNORMAL HIGH (ref 0–44)
AST: >10000 U/L — ABNORMAL HIGH (ref 15–41)
Albumin: 2.8 g/dL — ABNORMAL LOW (ref 3.5–5.0)
Alkaline Phosphatase: 927 U/L — ABNORMAL HIGH (ref 38–126)
Anion gap: 20 — ABNORMAL HIGH (ref 5–15)
BUN: 18 mg/dL (ref 6–20)
CO2: 21 mmol/L — ABNORMAL LOW (ref 22–32)
Calcium: 5.8 mg/dL — CL (ref 8.9–10.3)
Chloride: 96 mmol/L — ABNORMAL LOW (ref 98–111)
Creatinine, Ser: 2.45 mg/dL — ABNORMAL HIGH (ref 0.61–1.24)
GFR calc Af Amer: 40 mL/min — ABNORMAL LOW (ref >60)
GFR calc non Af Amer: 35 mL/min — ABNORMAL LOW (ref >60)
Glucose, Bld: 140 mg/dL — ABNORMAL HIGH (ref 70–99)
Potassium: 4.1 mmol/L (ref 3.5–5.1)
Sodium: 137 mmol/L (ref 135–145)
Total Bilirubin: 6.6 mg/dL — ABNORMAL HIGH (ref 0.3–1.2)
Total Protein: 5.4 g/dL — ABNORMAL LOW (ref 6.5–8.1)

## 2019-12-05 LAB — HEPATIC FUNCTION PANEL
ALT: 6822 U/L — ABNORMAL HIGH (ref 0–44)
AST: >10000 U/L — ABNORMAL HIGH (ref 15–41)
Albumin: 2.5 g/dL — ABNORMAL LOW (ref 3.5–5.0)
Alkaline Phosphatase: 595 U/L — ABNORMAL HIGH (ref 38–126)
Bilirubin, Direct: 3.3 mg/dL — ABNORMAL HIGH (ref 0.0–0.2)
Indirect Bilirubin: 1.9 mg/dL — ABNORMAL HIGH (ref 0.3–0.9)
Total Bilirubin: 5.2 mg/dL — ABNORMAL HIGH (ref 0.3–1.2)
Total Protein: 5.7 g/dL — ABNORMAL LOW (ref 6.5–8.1)

## 2019-12-05 LAB — PROTIME-INR
INR: 3.1 — ABNORMAL HIGH (ref 0.8–1.2)
INR: 3.2 — ABNORMAL HIGH (ref 0.8–1.2)
Prothrombin Time: 30.7 seconds — ABNORMAL HIGH (ref 11.4–15.2)
Prothrombin Time: 31.6 seconds — ABNORMAL HIGH (ref 11.4–15.2)

## 2019-12-05 LAB — TRIGLYCERIDES: Triglycerides: 229 mg/dL — ABNORMAL HIGH (ref <150)

## 2019-12-05 MED ORDER — LACTATED RINGERS IV BOLUS: 1000.0000 mL | Freq: Once | INTRAVENOUS | Status: DC

## 2019-12-05 MED ORDER — SODIUM BICARBONATE-DEXTROSE 150-5 MEQ/L-% IV SOLN
150.0000 meq | INTRAVENOUS | Status: DC
  Administered 2019-12-05: 150 meq via INTRAVENOUS

## 2019-12-05 MED ORDER — VITAMIN K1 10 MG/ML IJ SOLN
10.0000 mg | Freq: Once | INTRAVENOUS | Status: AC
  Administered 2019-12-05: 10 mg via INTRAVENOUS
  Filled 2019-12-05: qty 1

## 2019-12-05 MED ORDER — CALCIUM GLUCONATE-NACL 2-0.675 GM/100ML-% IV SOLN
2.0000 g | Freq: Once | INTRAVENOUS | Status: AC
  Administered 2019-12-05: 2000 mg via INTRAVENOUS
  Filled 2019-12-05: qty 100

## 2019-12-05 MED ORDER — ALBUMIN HUMAN 25 % IV SOLN
50.0000 g | Freq: Once | INTRAVENOUS | Status: AC
  Administered 2019-12-05: 50 g via INTRAVENOUS

## 2019-12-05 MED ORDER — LACTATED RINGERS IV BOLUS: 500.0000 mL | Freq: Once | INTRAVENOUS | Status: DC

## 2019-12-05 MED ORDER — PANTOPRAZOLE SODIUM 40 MG IV SOLR
40.0000 mg | Freq: Two times a day (BID) | INTRAVENOUS | Status: DC
  Administered 2019-12-05 – 2019-12-07 (×6): 40 mg via INTRAVENOUS
  Filled 2019-12-05 (×6): qty 40

## 2019-12-05 MED ORDER — LACTATED RINGERS IV BOLUS
1000.0000 mL | Freq: Once | INTRAVENOUS | Status: AC
  Administered 2019-12-05: 1000 mL via INTRAVENOUS

## 2019-12-05 MED ORDER — ALBUMIN HUMAN 25 % IV SOLN
50.0000 g | Freq: Once | INTRAVENOUS | Status: AC
  Administered 2019-12-05: 50 g via INTRAVENOUS
  Filled 2019-12-05: qty 200

## 2019-12-05 MED ORDER — SODIUM CHLORIDE 0.9 % IV SOLN
4.0000 g | Freq: Once | INTRAVENOUS | Status: AC
  Administered 2019-12-05: 4 g via INTRAVENOUS
  Filled 2019-12-05: qty 40

## 2019-12-05 MED ORDER — SODIUM BICARBONATE-DEXTROSE 150-5 MEQ/L-% IV SOLN
150.0000 meq | INTRAVENOUS | Status: DC
  Administered 2019-12-05 (×3): 150 meq via INTRAVENOUS
  Filled 2019-12-05 (×3): qty 1000

## 2019-12-05 MED ORDER — SODIUM CHLORIDE 0.9% FLUSH: 10.0000 mL | INTRAVENOUS | Status: DC | PRN

## 2019-12-05 MED ORDER — CALCIUM GLUCONATE-NACL 1-0.675 GM/50ML-% IV SOLN
1.0000 g | Freq: Once | INTRAVENOUS | Status: AC
  Administered 2019-12-05: 1000 mg via INTRAVENOUS
  Filled 2019-12-05: qty 50

## 2019-12-05 MED ORDER — SODIUM CHLORIDE 0.9% FLUSH
10.0000 mL | Freq: Two times a day (BID) | INTRAVENOUS | Status: DC
  Administered 2019-12-05: 30 mL
  Administered 2019-12-05 – 2019-12-07 (×5): 10 mL

## 2019-12-05 MED FILL — Sodium Chloride IV Soln 0.9%: INTRAVENOUS | Qty: 250 | Status: AC

## 2019-12-05 MED FILL — Phenylephrine HCl IV Soln 10 MG/ML: INTRAVENOUS | Qty: 10 | Status: AC

## 2019-12-05 NOTE — Progress Notes (Signed)
[facility] Progress Note    Assessment/ Plan:   1.  AKI: acute oliguric AKi--> continue CRRT.  All 4K bath.  isotonic bicarb gtt in post fluids.   No heparin.  Have added LFT's to daily labs and dc'd comprehensive metabolics since he gets daily RFP.   2.  Profound shock: acute decompensation.  Increase lactate and pressor requirement.  Ct abd/ pelvis with contrast necessary.  On 5 pressors, getting epi pushes. TTE with EF 60-65% and no sig RV dysfunction.  ? Pseudocyst.  Antibiotics on board,  Blood cultures 5/13 1/2 CONS.  On vanc/ meropenem.  Hgb has been OK.  Stress dose steroids.    3.  Pancreatitis: CT abd/ pelvis showing pancreatitis, lack of IV contrast difficult to determine necrosis or not. Abd [place] with fatty liver.  TG 1100--> 531.    4.  DKA: per CCM  5.  Acute respiratory failure: intubated 5/16  6.  Acute encephalopathy: CT head L frontal periventricular white matter.  Per primary   Kelly Splinter, MD 12/05/2019, 5:11 PM      Subjective:    + 6 L yest (12 L in and 6 L out). Anuric.  K+ okay.    Objective:   BP (!) 141/32   Pulse 93   Temp (!) 96.6 F (35.9 C) (Axillary)   Resp (!) 36   Ht 6' (1.829 m)   Wt (!) 166.7 kg   SpO2 99%   BMI 49.84 kg/m   Intake/Output Summary (Last 24 hours) at 12/05/2019 1558 Last data filed at 12/05/2019 1500 Gross per 24 hour  Intake 20269.69 ml  Output 8870 ml  Net 11399.69 ml   Weight change: 3.5 kg  Physical Exam: GEN: intubated, ill HEENT eyes closed NECK  No JVD PULM tachypneic CV tachycardic ABD soft, somewhat distended EXT trace edema NEURO obtunded SKIN no rashes ACCESS: R IJ nontunneled HD cath  Imaging: DG Chest 1 View  Result Date: 12/04/2019 CLINICAL DATA:  Check endotracheal tube placement EXAM: CHEST  1 VIEW COMPARISON:  Film from 12/02/2019 FINDINGS: Endotracheal tube is noted 1 cm above the carina. Gastric catheter is noted extending into the stomach. Overall inspiratory effort  is noted. Elevation the right hemidiaphragm is again seen. Bilateral jugular catheters are again noted and stable. IMPRESSION: Endotracheal tube and gastric catheter as described. Electronically Signed   By: Inez Catalina M.D.   On: 12/04/2019 13:58   DG Abd 1 View  Result Date: 12/04/2019 CLINICAL DATA:  Orogastric tube placement EXAM: ABDOMEN - 1 VIEW COMPARISON:  CT 12/15/2019 FINDINGS: Interval placement of an enteric tube which terminates the level of the distal stomach. Relative paucity of bowel gas within the upper abdomen. Visualized left lung base appears clear. IMPRESSION: Enteric tube terminates at the level of the distal stomach. Electronically Signed   By: Davina Poke D.O.   On: 12/04/2019 14:34   CT ABDOMEN PELVIS W CONTRAST  Result Date: 12/04/2019 CLINICAL DATA:  Sepsis. Concern for pancreatic abscess. Progressive septic shock. EXAM: CT ABDOMEN AND PELVIS WITH CONTRAST TECHNIQUE: Multidetector CT imaging of the abdomen and pelvis was performed using the standard protocol following bolus administration of intravenous contrast. CONTRAST:  175m OMNIPAQUE IOHEXOL 300 MG/ML  SOLN COMPARISON:  CT 3 days ago. FINDINGS: Lower chest: Central lines in the SVC. Bibasilar opacities with air bronchograms, favoring atelectasis. Small right pleural effusion. Hepatobiliary: Marked hepatic steatosis with areas of focal fatty sparing. High-density material in the gallbladder may represent sludge. No obvious  gallstones. No biliary dilatation. Pancreas: Diffuse severe peripancreatic fat stranding and edema. Pancreatic parenchyma appears edematous and is ill-defined. Possible diminished areas enhancement involving the uncinate process and proximal body which may represent early necrosis, however no pancreatic air. Generalized peripancreatic free fluid which tracks into the lesser sac and left pericolic gutter. No organized or drainable acute peripancreatic collection. Spleen: Grossly negative.  Adrenals/Urinary Tract: Normal adrenal glands. No hydronephrosis. Absent renal excretion on delayed phase imaging. Urinary bladder is near completely empty. Stomach/Bowel: Enteric tube tip in the stomach. Minister enteric contrast reaches the mid small bowel. Few prominent proximal small bowel loops likely reactive ileus, no obstruction. Normal appendix. Majority of the colon is decompressed. Vascular/Lymphatic: Right femoral catheter tip in the right external iliac vein. Stranding adjacent to the left iliac vessels likely iatrogenic. Normal caliber abdominal aorta. Portal vein is not well assessed on the current exam. Reproductive: Prostate is unremarkable. Other: Non organized peripancreatic free fluid which tracks into the lesser sac, left greater than right pericolic gutters and into the pelvis. There is no organized or drainable fluid collection. No free air. Musculoskeletal: There are no acute or suspicious osseous abnormalities. IMPRESSION: 1. Severe acute pancreatitis with prominent pancreatic edema and peripancreatic free fluid. Possible diminished areas of enhancement involving the uncinate process and proximal body which may represent early necrosis, however no pancreatic air. No organized or drainable peripancreatic collection. Body habitus tank coli limits detailed evaluation. 2. Marked hepatic steatosis with areas of focal fatty sparing. 3. Absent renal excretion on delayed phase imaging suggests underlying renal dysfunction. 4. Bibasilar opacities with air bronchograms, favoring atelectasis. Small right pleural effusion. Electronically Signed   By: Keith Rake M.D.   On: 12/04/2019 16:43   ECHOCARDIOGRAM COMPLETE  Result Date: 12/04/2019    ECHOCARDIOGRAM REPORT   Patient Name:   Edward Woods Date of Exam: 12/04/2019 Medical Rec #:  [id number]           Height:       72.0 in Accession #:    [id number]          Weight:       359.8 lb Date of Birth:  1991-02-21            BSA:          2.734  m Patient Age:    29 years            BP:           62/47 mmHg Patient Gender: M                   HR:           96 bpm. Exam Location:  Inpatient Procedure: 2D Echo Indications:    Shock  History:        Patient has no prior history of Echocardiogram examinations.                 Risk Factors:Diabetes.  Sonographer:    Mikki Santee RDCS (AE) Referring Phys: [id number] Julian Hy  Sonographer Comments: Patient is morbidly obese, suboptimal apical window and suboptimal subcostal window. IMPRESSIONS  1. Left ventricular ejection fraction, by estimation, is 60 to 65%. The left ventricle has normal function. The left ventricle has no regional wall motion abnormalities. There is mild left ventricular hypertrophy. Left ventricular diastolic parameters are indeterminate.  2. Right ventricular systolic function is moderately reduced. The right ventricular size is normal.  3. The mitral valve is grossly normal.  Trivial mitral valve regurgitation.  4. The aortic valve is tricuspid. Aortic valve regurgitation is not visualized. FINDINGS  Left Ventricle: Left ventricular ejection fraction, by estimation, is 60 to 65%. The left ventricle has normal function. The left ventricle has no regional wall motion abnormalities. Definity contrast agent was given IV to delineate the left ventricular  endocardial borders. The left ventricular internal cavity size was normal in size. There is mild left ventricular hypertrophy. Left ventricular diastolic parameters are indeterminate. Right Ventricle: The right ventricular size is normal. No increase in right ventricular wall thickness. Right ventricular systolic function is moderately reduced. Left Atrium: Left atrial size was normal in size. Right Atrium: Right atrial size was normal in size. Pericardium: There is no evidence of pericardial effusion. Mitral Valve: The mitral valve is grossly normal. Trivial mitral valve regurgitation. Tricuspid Valve: The tricuspid valve is grossly  normal. Tricuspid valve regurgitation is trivial. Aortic Valve: The aortic valve is tricuspid. Aortic valve regurgitation is not visualized. Pulmonic Valve: The pulmonic valve was not well visualized. Pulmonic valve regurgitation is trivial. Aorta: The aortic root is normal in size and structure. Venous: The inferior vena cava was not well visualized. IAS/Shunts: The interatrial septum was not well visualized.  LEFT VENTRICLE PLAX 2D LVIDd:         3.70 cm LVIDs:         2.60 cm LV PW:         1.20 cm LV IVS:        1.20 cm LVOT diam:     2.00 cm LV SV:         36 LV SV Index:   13 LVOT Area:     3.14 cm  RIGHT VENTRICLE RV S prime:     19.40 cm/s TAPSE (M-mode): 1.5 cm LEFT ATRIUM             Index      RIGHT ATRIUM          Index LA diam:        2.50 cm 0.91 cm/m RA Area:     6.76 cm LA Vol (A2C):   10.4 ml 3.80 ml/m RA Volume:   9.60 ml  3.51 ml/m LA Vol (A4C):   17.9 ml 6.55 ml/m LA Biplane Vol: 14.2 ml 5.19 ml/m  AORTIC VALVE LVOT Vmax:   61.70 cm/s LVOT Vmean:  41.600 cm/s LVOT VTI:    0.116 m  AORTA Ao Root diam: 2.70 cm  SHUNTS Systemic VTI:  0.12 m Systemic Diam: 2.00 cm Rozann Lesches MD Electronically signed by Rozann Lesches MD Signature Date/Time: 12/04/2019/11:21:07 AM    Final     Labs: BMET Recent Labs  Lab 12/02/2019 2147 12/02/19 0202 12/02/19 1600 12/02/19 1600 12/03/19 0256 12/03/19 1407 12/03/19 1500 12/04/19 0430 12/04/19 1119 12/04/19 1417 12/04/19 1600 12/05/19 0000 12/05/19 0406  NA 151*   < > 148*   < > 141  141   < > 143 139 136 141 141 139 141  K 4.4   < > 4.3   < > 4.3  4.4   < > 4.4 4.7 4.7 5.0 5.1 4.5 4.4  CL 120*   < > 114*   < > 113*  113*   < > 108 98 98 100 97* 98 99  CO2 13*   < > 19*   < > 16*  17*   < > 17* 21* 14* 7* 6* 8* 10*  GLUCOSE 415*   < > 228*   < >  177*  179*   < > 203* 169* 177* 304* 321* 231* 221*  BUN 39*   < > 39*   < > 33*  33*   < > 30* 27* 26* 25* 27* 21* 20  CREATININE 4.56*   < > 5.92*   < > 5.32*  5.46*   < > 4.32* 3.75*  3.61* 4.40* 4.51* 3.26* 3.04*  CALCIUM 8.7*   < > 7.4*   < > 7.1*  7.1*   < > 6.8* 7.0* 6.7* 7.4* 6.7* 5.7* 6.0*  PHOS 2.1*  --  1.6*  --  2.6  --  5.2* 6.3*  --  9.1*  --   --  8.3*   < > = values in this interval not displayed.   CBC Recent Labs  Lab 12/10/2019 0641 12/13/2019 1156 12/03/19 1407 12/04/19 0430 12/04/19 1600 12/05/19 0406  WBC 20.5*   < > 14.9* 17.8* 27.1* 30.3*  NEUTROABS 16.7*  --   --   --   --   --   HGB 18.6*   < > 14.4 14.7 10.1* 10.5*  HCT 57.4*   < > 43.4 44.6 33.3* 33.1*  MCV 86.7   < > 85.6 85.6 94.9 89.9  PLT 540*   < > 91* 95* 85* 54*   < > = values in this interval not displayed.    Medications:    . chlorhexidine gluconate (MEDLINE KIT)  15 mL Mouth Rinse BID  . Chlorhexidine Gluconate Cloth  6 each Topical Daily  . docusate  100 mg Oral BID  . fentaNYL (SUBLIMAZE) injection  50 mcg Intravenous Once  . hydrocortisone sod succinate (SOLU-CORTEF) inj  50 mg Intravenous Q6H  . mouth rinse  15 mL Mouth Rinse BID  . mouth rinse  15 mL Mouth Rinse 10 times per day  . pantoprazole (PROTONIX) IV  40 mg Intravenous Q12H  . polyethylene glycol  17 g Oral Daily  . senna  1 tablet Per Tube Daily  . sodium chloride flush  10-40 mL Intracatheter Q12H

## 2019-12-05 NOTE — Progress Notes (Signed)
CRITICAL VALUE ALERT  Critical Value:  Ca 5.7  Date & Time Notied:  5/17 @ 0026  Provider Notified: elink  Orders Received/Actions taken: awaiting orders

## 2019-12-05 NOTE — Progress Notes (Signed)
Assisted tele visit to patient with mother.  Adelma Bowdoin M, RN  

## 2019-12-05 NOTE — Progress Notes (Addendum)
NAME:  Edward Woods, MRN:  [id number], DOB:  1991/03/29, LOS: 4 ADMISSION DATE:  12/15/19, CONSULTATION DATE:  5/13 REFERRING MD:  Lupita Leash, CHIEF COMPLAINT: Diabetic ketoacidosis with acute metabolic encephalopathy  Brief History    29 year old male admitted with diabetic ketoacidosis, multiorgan failure secondary to pancreatitis from hyperlipidemia.  Started CRRT but with worsening clinical status he was intubated 5/16 Remains profoundly acidotic on 5 pressors.  Past Medical History   No known medical history Significant Hospital Events   5/13 Admitted with working diagnosis of DKA, administered IV fluids, insulin started .  5/14 CRRT started 5/16-intubated for worsening shock, renal failure.  Started on giapressa in addition to neo, vaso, levo and epinephrine  Consults:  Critical care, nephrology  Procedures:  Left IJ CVL 5/14 Right IJ HD catheter 5/14 Right femoral A-line 5/14 ET tube 5/16  Significant Diagnostic Tests:  Right upper quadrant ultrasound 4/13-fatty liver with no dilatation of common bile duct.  CT abdomen pelvis 5/16-severe pancreatitis and peripancreatic free fluid.  Possible early necrosis.  Marked hepatic steatosis, basilar airspace opacities with moderate effusion.  Echocardiogram 5/16-LVEF 60 to 65%, mild LVH, RV systolic function is moderately reduced.  Micro Data:  BCX2 5/13>>> CONS 2/4  Antimicrobials:   5/13 - Zosyn 5/14 -Merrem >> 5/14 vanc>>   Interim history/subjective:   On 5 pressors, remains profoundly acidotic with lactic acid greater than 11 Requiring additional epi pushes a few times every hour. Anuric, on CVVH.  Objective   Blood pressure (!) 72/48, pulse 95, temperature 98.3 F (36.8 C), temperature source Axillary, resp. rate (!) 34, height 6' (1.829 m), weight (!) 166.7 kg, SpO2 96 %. CVP:  [13 mmHg] 13 mmHg   Intake/Output Summary (Last 24 hours) at 12/05/2019 0804 Last data filed at 12/05/2019 0800 Gross per 24 hour   Intake 17030.96 ml  Output 8995 ml  Net 8035.96 ml   Filed Weights   12/03/19 0417 12/04/19 0433 12/05/19 0500  Weight: (!) 164.2 kg (!) 163.2 kg (!) 166.7 kg    Gen:      No acute distress, obese HEENT:  EOMI, sclera anicteric, ETT Neck:     No masses; no thyromegaly Lungs:    Clear to auscultation bilaterally; normal respiratory effort CV:         Regular rate and rhythm; no murmurs Abd:      Distended abdomen, winces to palpation. 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  No evidence of biliary issues. Continue supportive care.  Will give additional fluids Continue pressors.  We will try to wean giapressa first if BP tolerated Increase max limit on norepinephrine and epinephrine Not a candidate for any intervention like surgery at this point  Acute hypoxic respiratory failure secondary to pancreatitis Start low TV ventilation as he is at risk for ARDS  Severe acidosis secondary to DKA, lactic acidosis Continue insulin drip, bicarb infusion and with CRRT  AKI, anuria Continue CRRT.  We will stop volume removal given hemodynamic instability  Acute metabolic encephalopathy Minimize sedation due to hypotension.  Elevated LFTs, thrombocytopenia, leukocytosis Secondary to shock, stress from critical illness Coag negative staph in blood is likely contaminant On empiric Vanco, meropenem.  Follow cultures  Intermittent atrial fibrillation Continue amiodarone drip Holding anticoagulation due to bleeding from  NG tube  Prognosis is guarded given multiorgan failure and profound shock, acidosis Mother has arrived from [place] and she was updated at bedside by me.  Best practice:  Diet: N.p.o.  Pain/Anxiety/Delirium protocol (if indicated):  VAP protocol (if indicated):  Not indicated DVT prophylaxis: [place] heparin GI prophylaxis: PPI Glucose control: Insulin drip Mobility: Bedrest for now Code Status: Full code Family Communication: Mother updated Disposition: ICU  The patient is critically ill with multiple organ system failure and requires high complexity decision making for assessment and support, frequent evaluation and titration of therapies, advanced monitoring, review of radiographic studies and interpretation of complex data.   Critical Care Time devoted to patient care services, exclusive of separately billable procedures, described in this note 80  minutes.   Chilton Greathouse MD [facility] Please see [email or web address] for pager details.  12/05/2019, 8:04 AM

## 2019-12-05 NOTE — Progress Notes (Signed)
Notified Dr. Isaiah Serge in regards to blood from OG, mouth, and in stool

## 2019-12-05 NOTE — Progress Notes (Addendum)
CRITICAL VALUE ALERT  Critical Value:  Lactic 9.9, Calcium 5.8  Date & Time Notied:  12/05/19 1645  Provider Notified: Dr. Isaiah Serge   Orders Received/Actions taken: Calcium replacement ordred

## 2019-12-05 NOTE — Consult Note (Signed)
WOC Nurse Consult Note: Reason for Consult: fractured toenail on left foot, 5th digit. Wound type: traumatic Pressure Injury POA: N/A Measurement:N/A Wound bed:N/A Drainage (amount, consistency, odor) None Periwound: No wound Dressing procedure/placement/frequency: I have provided Nursing with conservative care orders for this patient; the nail is fractured, but there are clearly higher priorities at this time. They will wash foot with soap and water once daily. Twice daily they will apply a betadine swabstick to the area and allow to air-dry. No dressing.  Feet are to be placed into bilateral pressure redistribution heel boots to prevent pressure injury.  Consider Podiatry consult for nail removal if you desire.  [facility] has made visits to this campus for patient consultations.  WOC nursing team will not follow, but will remain available to this patient, the nursing and medical teams.  Please re-consult if needed. Thanks, Ladona Mow, MSN, RN, GNP, [facility] [place]  Pager# [phone]

## 2019-12-05 NOTE — Progress Notes (Signed)
Dr. Violet Baldy notified of abnormal labs, no new orders at this time. Pt on CRRT and has pancreatitis.

## 2019-12-05 NOTE — Progress Notes (Signed)
Initial Nutrition Assessment  DOCUMENTATION CODES:   Morbid obesity  INTERVENTION:  - recommend TF initiation as soon as feasible, or TPN if TF is not feasible within the next 72 hours.    NUTRITION DIAGNOSIS:   Inadequate oral intake related to inability to eat as evidenced by NPO status.  GOAL:   Provide needs based on ASPEN/SCCM guidelines  MONITOR:   Vent status, Labs, Weight trends  REASON FOR ASSESSMENT:   Ventilator  ASSESSMENT:   29 y.o. male with no past medical history. He presented to the ED on 5/13 with generalized weakness, malaise, SOB, abdominal pain, N/V, excessive thirst, and excessive urination x2-3 days.  Patient was intubated 5/16 at ~1310 and OGT placed gastrically at that time. Patient remains intubated with OGT to LIS.   Mom was at bedside and provided information. They live together and mom has health issues so patient cares for her and does the majority of the cooking in the home. Over the past few months, they have been making healthier food choices (such as including more vegetables, choosing ground [place] rather than ground beef) although patient does not frequently consume fruit. He usually eats meals but is able to go long periods without feeling hungry or eating.  Patient worked a job that required him to be physically active, but he lost his job shortly after the first of the year. Since that time he continues to go for walks, but has been more sedentary (mom thinks he has been depressed).   Per chart review, weight today is 367 lb and weight on admission was 362 lb. In rounds it was reported that patient is +10 L since admission. CRRT was started on 5/14.  During rounds, Dr. Vaughan Browner reported plan to hold on nutrition initiation at this time. Will monitor for when feasible.    Patient is currently intubated on ventilator support MV: 17.3 L/min Temp (24hrs), Avg:96.6 F (35.9 C), Min:94.9 F (34.9 C), Max:98.3 F (36.8 C) Propofol:  none BP: 143/66 and MAP: 84  Labs reviewed; CBGs: 167-216 mg/dl, craetinine: 3.04 mg/dl, Ca: 6 mg/dl, Phos: 8.3 mg/dl, Mg: 2.9 mg/dl, Alk Phos elevated, LFTs very elevated and trending up, GFR: 31 ml/min.  Medications reviewed; IVF; D5-1/2 NS @ 100 ml/hr (408 kcal). Drips; insulin @ 30 units/hr, neo @ 380 mcg/min, vaso @ 0.05 units/min, levo @ 80 mcg/min.    NUTRITION - FOCUSED PHYSICAL EXAM:  completed; no muscle or fat wasting, mild edema noted to all extremities. Noted that L foot was cold but all other extremities and the rest of the LLE was warm.   Diet Order:   Diet Order            Diet NPO time specified  Diet effective now              EDUCATION NEEDS:   Not appropriate for education at this time  Skin:  Skin Assessment: Reviewed RN Assessment  Last BM:  5/17  Height:   Ht Readings from Last 1 Encounters:  12/05/19 6' (1.829 m)    Weight:   Wt Readings from Last 1 Encounters:  12/05/19 (!) 166.7 kg    Ideal Body Weight:  80.9 kg  BMI:  Body mass index is 49.84 kg/m.  Estimated Nutritional Needs:   Kcal:  9179-1505 kcal  Protein:  >/= 202 grams  Fluid:  >/= 1.8 L/day     Jarome Matin, MS, RD, LDN, CNSC Inpatient Clinical Dietitian RD pager # available in AMION  After  hours/weekend pager # available in [facility]

## 2019-12-05 NOTE — Progress Notes (Signed)
CCM MD requesting that patient be kept 200cc positive each hour with CVVHD

## 2019-12-05 NOTE — Progress Notes (Signed)
Critical calcium 6.0 was called in to elink [employer].

## 2019-12-05 NOTE — Progress Notes (Signed)
CRITICAL VALUE ALERT  Critical Value: pH 7.143  Date & Time Notied:  5/16 @ 0008am  Provider Notified: elink   Orders Received/Actions taken: awaiting orders

## 2019-12-05 NOTE — Progress Notes (Addendum)
PCCM progress note  Patient clinical status improved through the day Ventilator changed to 6 cc/kg tidal volume,  ABG 7.37/37/74/92% Now off giapressa. and epinephrine Neo is weaning down. Reduce bicarb drip to 100 and can stop it based on review of repeat chemistries. We have room to give more fluids as bladder pressure is 16 and FiO2 is 50% Keep ~ 200cc positive/hr  The patient is critically ill with multiple organ system failure and requires high complexity decision making for assessment and support, frequent evaluation and titration of therapies, advanced monitoring, review of radiographic studies and interpretation of complex data.   Critical Care Time devoted to patient care services, exclusive of separately billable procedures, described in this note is 35 minutes.   Chilton Greathouse MD [place] [facility] Please see [email or web address] for pager details.  12/05/2019, 4:35 PM

## 2019-12-05 NOTE — Progress Notes (Signed)
Per Dr. Isaiah Serge, we will not pull off any fluid with CRRT and we will keep pt net positive for the next 6 hours. Will reassess this afternoon if pt can tolerate keeping even later. Confirmed with Dr. Isaiah Serge that pt is receiving about 754mL/hr and he will be positive this much each hour. MD confirmed

## 2019-12-05 NOTE — Progress Notes (Signed)
eLink Physician-Brief Progress Note Patient Name: Edward Woods DOB: 1991-05-27 MRN: [id number]   Date of Service  12/05/2019  HPI/Events of Note  Notifed of calcium 6.0. when corrected to yesterdays albumin its 7.7  eICU Interventions  Will add on albumin level to get an accurate number Will defer correction as there are studies that states further cell necrosis with calcium correction in patients with pancreatitis.     Intervention Category Major Interventions: Electrolyte abnormality - evaluation and management  Darl Pikes 12/05/2019, 6:03 AM

## 2019-12-05 NOTE — TOC Progression Note (Signed)
Transition of Care [facility]) - Progression Note    Patient Details  Name: Edward Woods MRN: [id number] Date of Birth: 1991-06-02  Transition of Care [facility]) CM/SW Contact  Golda Acre, RN Phone Number: 12/05/2019, 10:39 AM  Clinical Narrative:    Remains on the vent, crrt, iv albumin, iv amiodarone, iv angiotensin II,iv levophed, IV adrenalin, iv neosyphrine, iv vanco. Critically ill.   Expected Discharge Plan: Home/Self Care Barriers to Discharge: Continued Medical Work up  Expected Discharge Plan and Services Expected Discharge Plan: Home/Self Care       Living arrangements for the past 2 months: Single Family Home                                       Social Determinants of Health (SDOH) Interventions    Readmission Risk Interventions No flowsheet data found.

## 2019-12-05 NOTE — Progress Notes (Signed)
Concerns for platelet drops were made aware to RN elink

## 2019-12-05 NOTE — Progress Notes (Signed)
Abdominal pressure checked, resulted at 16. MD aware

## 2019-12-06 ENCOUNTER — Inpatient Hospital Stay (HOSPITAL_COMMUNITY): Payer: Medicaid Other

## 2019-12-06 DIAGNOSIS — J9600 Acute respiratory failure, unspecified whether with hypoxia or hypercapnia: Secondary | ICD-10-CM

## 2019-12-06 DIAGNOSIS — J9601 Acute respiratory failure with hypoxia: Secondary | ICD-10-CM

## 2019-12-06 LAB — BASIC METABOLIC PANEL
Anion gap: 22 — ABNORMAL HIGH (ref 5–15)
Anion gap: 26 — ABNORMAL HIGH (ref 5–15)
BUN: 15 mg/dL (ref 6–20)
BUN: 19 mg/dL (ref 6–20)
CO2: 14 mmol/L — ABNORMAL LOW (ref 22–32)
CO2: 12 mmol/L — ABNORMAL LOW (ref 22–32)
Calcium: 6.2 mg/dL — CL (ref 8.9–10.3)
Calcium: 6.3 mg/dL — CL (ref 8.9–10.3)
Chloride: 95 mmol/L — ABNORMAL LOW (ref 98–111)
Chloride: 93 mmol/L — ABNORMAL LOW (ref 98–111)
Creatinine, Ser: 2.06 mg/dL — ABNORMAL HIGH (ref 0.61–1.24)
Creatinine, Ser: 2.51 mg/dL — ABNORMAL HIGH (ref 0.61–1.24)
GFR calc Af Amer: 50 mL/min — ABNORMAL LOW (ref >60)
GFR calc Af Amer: 39 mL/min — ABNORMAL LOW (ref >60)
GFR calc non Af Amer: 43 mL/min — ABNORMAL LOW (ref >60)
GFR calc non Af Amer: 34 mL/min — ABNORMAL LOW (ref >60)
Glucose, Bld: 367 mg/dL — ABNORMAL HIGH (ref 70–99)
Glucose, Bld: 146 mg/dL — ABNORMAL HIGH (ref 70–99)
Potassium: 6.4 mmol/L (ref 3.5–5.1)
Potassium: 6.8 mmol/L (ref 3.5–5.1)
Sodium: 131 mmol/L — ABNORMAL LOW (ref 135–145)
Sodium: 131 mmol/L — ABNORMAL LOW (ref 135–145)

## 2019-12-06 LAB — BLOOD GAS, ARTERIAL
Acid-base deficit: 3.1 mmol/L — ABNORMAL HIGH (ref 0.0–2.0)
Acid-base deficit: 10.3 mmol/L — ABNORMAL HIGH (ref 0.0–2.0)
Bicarbonate: 20.9 mmol/L (ref 20.0–28.0)
Bicarbonate: 14.1 mmol/L — ABNORMAL LOW (ref 20.0–28.0)
FIO2: 50
O2 Saturation: 97.3 %
O2 Saturation: 94.6 %
Patient temperature: 95.7
Patient temperature: 98.6
pCO2 arterial: 33.2 mmHg (ref 32.0–48.0)
pCO2 arterial: 27.4 mmHg — ABNORMAL LOW (ref 32.0–48.0)
pH, Arterial: 7.406 (ref 7.350–7.450)
pH, Arterial: 7.332 — ABNORMAL LOW (ref 7.350–7.450)
pO2, Arterial: 100 mmHg (ref 83.0–108.0)
pO2, Arterial: 89 mmHg (ref 83.0–108.0)

## 2019-12-06 LAB — RENAL FUNCTION PANEL
Albumin: 3 g/dL — ABNORMAL LOW (ref 3.5–5.0)
Albumin: 2.1 g/dL — ABNORMAL LOW (ref 3.5–5.0)
Anion gap: 21 — ABNORMAL HIGH (ref 5–15)
Anion gap: 24 — ABNORMAL HIGH (ref 5–15)
BUN: 17 mg/dL (ref 6–20)
BUN: 16 mg/dL (ref 6–20)
CO2: 18 mmol/L — ABNORMAL LOW (ref 22–32)
CO2: 12 mmol/L — ABNORMAL LOW (ref 22–32)
Calcium: 6.2 mg/dL — CL (ref 8.9–10.3)
Calcium: 6.2 mg/dL — CL (ref 8.9–10.3)
Chloride: 96 mmol/L — ABNORMAL LOW (ref 98–111)
Chloride: 95 mmol/L — ABNORMAL LOW (ref 98–111)
Creatinine, Ser: 1.98 mg/dL — ABNORMAL HIGH (ref 0.61–1.24)
Creatinine, Ser: 2.05 mg/dL — ABNORMAL HIGH (ref 0.61–1.24)
GFR calc Af Amer: 52 mL/min — ABNORMAL LOW (ref >60)
GFR calc Af Amer: 50 mL/min — ABNORMAL LOW (ref >60)
GFR calc non Af Amer: 45 mL/min — ABNORMAL LOW (ref >60)
GFR calc non Af Amer: 43 mL/min — ABNORMAL LOW (ref >60)
Glucose, Bld: 98 mg/dL (ref 70–99)
Glucose, Bld: 367 mg/dL — ABNORMAL HIGH (ref 70–99)
Phosphorus: 6.1 mg/dL — ABNORMAL HIGH (ref 2.5–4.6)
Phosphorus: 8.4 mg/dL — ABNORMAL HIGH (ref 2.5–4.6)
Potassium: 5.7 mmol/L — ABNORMAL HIGH (ref 3.5–5.1)
Potassium: 6.4 mmol/L (ref 3.5–5.1)
Sodium: 135 mmol/L (ref 135–145)
Sodium: 131 mmol/L — ABNORMAL LOW (ref 135–145)

## 2019-12-06 LAB — CBC
HCT: 30.7 % — ABNORMAL LOW (ref 39.0–52.0)
Hemoglobin: 9.9 g/dL — ABNORMAL LOW (ref 13.0–17.0)
MCH: 28.4 pg (ref 26.0–34.0)
MCHC: 32.2 g/dL (ref 30.0–36.0)
MCV: 88 fL (ref 80.0–100.0)
Platelets: 41 10*3/uL — ABNORMAL LOW (ref 150–400)
RBC: 3.49 MIL/uL — ABNORMAL LOW (ref 4.22–5.81)
RDW: 16.2 % — ABNORMAL HIGH (ref 11.5–15.5)
WBC: 28.2 10*3/uL — ABNORMAL HIGH (ref 4.0–10.5)
nRBC: 11.8 % — ABNORMAL HIGH (ref 0.0–0.2)

## 2019-12-06 LAB — CULTURE, BLOOD (ROUTINE X 2)
Culture: NO GROWTH
Special Requests: ADEQUATE

## 2019-12-06 LAB — GLUCOSE, CAPILLARY
Glucose-Capillary: 104 mg/dL — ABNORMAL HIGH (ref 70–99)
Glucose-Capillary: 118 mg/dL — ABNORMAL HIGH (ref 70–99)
Glucose-Capillary: 140 mg/dL — ABNORMAL HIGH (ref 70–99)
Glucose-Capillary: 150 mg/dL — ABNORMAL HIGH (ref 70–99)
Glucose-Capillary: 150 mg/dL — ABNORMAL HIGH (ref 70–99)
Glucose-Capillary: 32 mg/dL — CL (ref 70–99)
Glucose-Capillary: 73 mg/dL (ref 70–99)
Glucose-Capillary: 77 mg/dL (ref 70–99)
Glucose-Capillary: 78 mg/dL (ref 70–99)
Glucose-Capillary: 80 mg/dL (ref 70–99)
Glucose-Capillary: 88 mg/dL (ref 70–99)
Glucose-Capillary: 89 mg/dL (ref 70–99)
Glucose-Capillary: 91 mg/dL (ref 70–99)
Glucose-Capillary: 94 mg/dL (ref 70–99)
Glucose-Capillary: 94 mg/dL (ref 70–99)
Glucose-Capillary: 97 mg/dL (ref 70–99)
Glucose-Capillary: 97 mg/dL (ref 70–99)
Glucose-Capillary: 98 mg/dL (ref 70–99)

## 2019-12-06 LAB — COOXEMETRY PANEL
Carboxyhemoglobin: 1 % (ref 0.5–1.5)
Methemoglobin: 1.8 % — ABNORMAL HIGH (ref 0.0–1.5)
O2 Saturation: 47 %
Total hemoglobin: 9.9 g/dL — ABNORMAL LOW (ref 12.0–16.0)

## 2019-12-06 LAB — PREPARE FRESH FROZEN PLASMA
Unit division: 0
Unit division: 0

## 2019-12-06 LAB — BPAM FFP
Blood Product Expiration Date: 202105171603
Blood Product Expiration Date: 202105212359
Blood Product Expiration Date: 202105212359
ISSUE DATE / TIME: 202105162046
ISSUE DATE / TIME: 202105162250
Unit Type and Rh: 5100
Unit Type and Rh: 5100
Unit Type and Rh: 5100

## 2019-12-06 LAB — TRIGLYCERIDES: Triglycerides: 210 mg/dL — ABNORMAL HIGH (ref <150)

## 2019-12-06 LAB — ECHOCARDIOGRAM LIMITED
Height: 72 in
Weight: 6038.84 oz

## 2019-12-06 LAB — LACTIC ACID, PLASMA
Lactic Acid, Venous: 10.7 mmol/L (ref 0.5–1.9)
Lactic Acid, Venous: >11 mmol/L (ref 0.5–1.9)

## 2019-12-06 LAB — PATHOLOGIST SMEAR REVIEW

## 2019-12-06 LAB — BLOOD GAS, VENOUS
Acid-base deficit: 4.9 mmol/L — ABNORMAL HIGH (ref 0.0–2.0)
Bicarbonate: 20.5 mmol/L (ref 20.0–28.0)
O2 Saturation: 48 %
Patient temperature: 98.6
pCO2, Ven: 42.2 mmHg — ABNORMAL LOW (ref 44.0–60.0)
pH, Ven: 7.308 (ref 7.250–7.430)
pO2, Ven: 35.4 mmHg (ref 32.0–45.0)

## 2019-12-06 LAB — TROPONIN I (HIGH SENSITIVITY)
Troponin I (High Sensitivity): 715 ng/L (ref <18)
Troponin I (High Sensitivity): 701 ng/L (ref <18)
Troponin I (High Sensitivity): 627 ng/L (ref <18)

## 2019-12-06 LAB — MAGNESIUM: Magnesium: 2.7 mg/dL — ABNORMAL HIGH (ref 1.7–2.4)

## 2019-12-06 MED ORDER — PRISMASOL BGK 0/2.5 32-2.5 MEQ/L REPLACEMENT SOLN: Status: DC

## 2019-12-06 MED ORDER — DEXTROSE 10 % IV SOLN: INTRAVENOUS | Status: DC

## 2019-12-06 MED ORDER — DEXTROSE-NACL 5-0.45 % IV SOLN: INTRAVENOUS | Status: DC

## 2019-12-06 MED ORDER — LACTATED RINGERS IV BOLUS
1000.0000 mL | Freq: Once | INTRAVENOUS | Status: AC
  Administered 2019-12-06: 1000 mL via INTRAVENOUS

## 2019-12-06 MED ORDER — SODIUM CHLORIDE 0.9 % IV SOLN
4.0000 g | Freq: Once | INTRAVENOUS | Status: AC
  Administered 2019-12-06: 4 g via INTRAVENOUS
  Filled 2019-12-06: qty 40

## 2019-12-06 MED ORDER — CALCIUM GLUCONATE-NACL 1-0.675 GM/50ML-% IV SOLN: 1.0000 g | Freq: Once | INTRAVENOUS | Status: DC

## 2019-12-06 MED ORDER — CALCIUM GLUCONATE 10 % IV SOLN: 2.0000 g | Freq: Once | INTRAVENOUS | Status: DC

## 2019-12-06 MED ORDER — ALBUMIN HUMAN 25 % IV SOLN
50.0000 g | Freq: Once | INTRAVENOUS | Status: AC
  Administered 2019-12-06: 50 g via INTRAVENOUS
  Filled 2019-12-06: qty 200

## 2019-12-06 MED ORDER — CALCIUM GLUCONATE-NACL 2-0.675 GM/100ML-% IV SOLN
2.0000 g | Freq: Once | INTRAVENOUS | Status: AC
  Administered 2019-12-06: 2000 mg via INTRAVENOUS
  Filled 2019-12-06: qty 100

## 2019-12-06 MED ORDER — INSULIN ASPART 100 UNIT/ML IV SOLN
10.0000 [IU] | Freq: Once | INTRAVENOUS | Status: AC
  Administered 2019-12-06: 10 [IU] via INTRAVENOUS

## 2019-12-06 MED ORDER — ARTIFICIAL TEARS OPHTHALMIC OINT
TOPICAL_OINTMENT | OPHTHALMIC | Status: DC | PRN
  Filled 2019-12-06: qty 3.5

## 2019-12-06 MED ORDER — INSULIN ASPART 100 UNIT/ML ~~LOC~~ SOLN
0.0000 [IU] | SUBCUTANEOUS | Status: DC
  Administered 2019-12-06 (×2): 2 [IU] via SUBCUTANEOUS
  Administered 2019-12-07: 3 [IU] via SUBCUTANEOUS
  Administered 2019-12-07: 2 [IU] via SUBCUTANEOUS

## 2019-12-06 MED ORDER — DEXTROSE 50 % IV SOLN
INTRAVENOUS | Status: AC
  Administered 2019-12-06: 50 mL via INTRAVENOUS
  Filled 2019-12-06: qty 100

## 2019-12-06 MED ORDER — SODIUM ZIRCONIUM CYCLOSILICATE 10 G PO PACK
10.0000 g | PACK | Freq: Every day | ORAL | Status: DC
  Administered 2019-12-06 – 2019-12-07 (×2): 10 g
  Filled 2019-12-06 (×3): qty 1

## 2019-12-06 MED ORDER — PIVOT 1.5 CAL PO LIQD
1000.0000 mL | ORAL | Status: DC
  Administered 2019-12-06 – 2019-12-07 (×2): 1000 mL
  Filled 2019-12-06 (×2): qty 1000

## 2019-12-06 MED ORDER — SODIUM BICARBONATE-DEXTROSE 150-5 MEQ/L-% IV SOLN
150.0000 meq | INTRAVENOUS | Status: DC
  Administered 2019-12-06 – 2019-12-07 (×3): 150 meq via INTRAVENOUS
  Filled 2019-12-06 (×3): qty 1000

## 2019-12-06 MED ORDER — PRISMASOL BGK 0/2.5 32-2.5 MEQ/L IV SOLN: INTRAVENOUS | Status: DC

## 2019-12-06 MED ORDER — DEXTROSE 50 % IV SOLN
1.0000 | Freq: Once | INTRAVENOUS | Status: AC
  Administered 2019-12-06: 50 mL via INTRAVENOUS

## 2019-12-06 NOTE — Progress Notes (Signed)
CRITICAL VALUE ALERT  Critical Value:  Lactic >11, K 6.4, Calcium 6.2, troponin 715  Date & Time Notied:  12/06/19 1700  Provider Notified: Dr. Isaiah Serge   Orders Received/Actions taken: See new orders

## 2019-12-06 NOTE — Progress Notes (Signed)
  Echocardiogram 2D Echocardiogram has been performed.  Celene Skeen 12/06/2019, 5:09 PM

## 2019-12-06 NOTE — Progress Notes (Signed)
CRITICAL VALUE ALERT  Critical Value:  Calcium 6.2 and lactic 10.7  Date & Time Notied:  12/06/19 0800  Provider Notified: Dr. Isaiah Serge   Orders Received/Actions taken: Calcium replacement and LR bolus

## 2019-12-06 NOTE — Progress Notes (Signed)
Per Dr. Arlean Hopping, prefers to use NS boluses due to LR containing potassium. See care order placed

## 2019-12-06 NOTE — Progress Notes (Signed)
PCCM progress note  Repeat labs reviewed Potassium elevated at 6.4, lactate back up to greater than 11 Dialysate changed on CVVH to deal with hyperkalemia We will give insulin, dextrose, calcium and restart bicarb Stat CT abdomen to reevaluate abdomen.  Concern for intra-abdominal acute process such as necrosis to explain the decline. Consider Lokelma if CT abdomen pelvis does not show ileus or infarction. Follow echocardiogram results.  The patient is critically ill with multiple organ system failure and requires high complexity decision making for assessment and support, frequent evaluation and titration of therapies, advanced monitoring, review of radiographic studies and interpretation of complex data.   Critical Care Time devoted to patient care services, exclusive of separately billable procedures, described in this note is 45 minutes.   Chilton Greathouse MD [place] Please see [email or web address] for pager details.  12/06/2019, 5:20 PM

## 2019-12-06 NOTE — Progress Notes (Addendum)
Pt BP began to drop, despite increase in pressors. RT changed a-line tubing and re-zeroed. Unable to obtain cuff pressure. Levophed increased to max dose of 100 mcg. CVP 5. CCM NP notified. 1 liter of LR given. CBG checked, read 32 despite being on 100 ml/hr of D5 1/2 NS. 1 amp of D50 given. CBG rechecked at 118 and D5 1/2 changed to D10. Per MD, will check CBGs q1 hour until blood sugar is consistently above 100

## 2019-12-06 NOTE — Progress Notes (Signed)
Discussed with Dr. Isaiah Serge need for tube feeds. MD ordered for post-pyloric dobhoff to be placed, and OG removed, in order to start tube feeds. Dobhoff placed and x-ray completed. MD updated

## 2019-12-06 NOTE — Progress Notes (Signed)
Pt O2 sats reading 58, even after changing probe, site and cord. Pt given O2 breath, suctioned, and CCM NP notified. ABG ordered and respiratory called. PO2 on ABG 89

## 2019-12-06 NOTE — Progress Notes (Signed)
Continuing to keep pt positive with CVVHD. CCM and Nephrology aware

## 2019-12-06 NOTE — Progress Notes (Signed)
Nutrition Follow-up  DOCUMENTATION CODES:   Morbid obesity  INTERVENTION:  - TF recommendations: Pivot 1.5 @ 10 ml/hr to advance by 10 ml every 12 hours to reach goal rate of 30 ml/hr with 60 ml prostat x6/day (12 packets/day)  - at goal rate, this regimen will provide 2280 kcal, 247 grams protein, and 540 ml free water.  - recommend 15 ml multivitamin/day d/t TF rate <42 ml/hr. - free water flush per CCM.    NUTRITION DIAGNOSIS:   Inadequate oral intake related to inability to eat as evidenced by NPO status. -ongoing  GOAL:   Provide needs based on ASPEN/SCCM guidelines -unable to meet at this time.  MONITOR:   Vent status, Labs, Weight trends, I & O's, Other (Comment)(TF initiation)  ASSESSMENT:   29 y.o. male with no past medical history. He presented to the ED on 5/13 with generalized weakness, malaise, SOB, abdominal pain, N/V, excessive thirst, and excessive urination x2-3 days.  Significant Events: 5/13- admission 5/14- CRRT started 5/16- intubated and OGT placed (gastric) 5/17- initial RD assessment   Patient discussed in rounds this AM. Patient remains intubated with OGT in place and plan for Dobhoff/small bore NGT to be placed post-pyloric and initiate TF. Weight continues to trend up and weight today is +7 kg/15 lb compared to weight on 5/15. Flow sheet documentation indicates mild edema to all extremities.   Per notes: - severe necrotizing pancreatitis with multiorgan failure - ARDS - insulin drip stopped - AKI--on CRRT - acute metabolic encephalopathy - guarded prognosis    Patient is currently intubated on ventilator support MV: 20.4 L/min Temp (24hrs), Avg:96 F (35.6 C), Min:94.8 F (34.9 C), Max:96.8 F (36 C) Propofol: none   Labs reviewed; CBGs: 73-104 mg/dl,  Medications reviewed; 50 mg albumin x1 dose 5/17, 4 g IV Ca gluconate x1 dose 5/18, 100 mg colace BID, 50 mg solu-cortef QID, 40 mg IV protonix BID, 10 mg IV vitamin K x1 run 5/17, 1  tablet senokot per OGT/day, 1 packet miralax/day.  IVF; D5-1/2 NS @ 100 ml/hr (408 kcal). Drips; neo @ 90 mcg/min, vaso @ 0.04 units/min, levo @ 59 mcg/min.   Diet Order:   Diet Order            Diet NPO time specified  Diet effective now              EDUCATION NEEDS:   Not appropriate for education at this time  Skin:  Skin Assessment: Reviewed RN Assessment  Last BM:  5/18  Height:   Ht Readings from Last 1 Encounters:  12/05/19 6' (1.829 m)    Weight:   Wt Readings from Last 1 Encounters:  12/06/19 (!) 171.2 kg    Estimated Nutritional Needs:  Kcal:  4081-4481 kcal Protein:  >/= 242 grams Fluid:  >/= 1.8 L/day     Trenton Gammon, MS, RD, LDN, CNSC Inpatient Clinical Dietitian RD pager # available in AMION  After hours/weekend pager # available in [facility]

## 2019-12-06 NOTE — Progress Notes (Signed)
NAME:  Edward Woods, MRN:  [id number], DOB:  1991-05-03, LOS: 5 ADMISSION DATE:  12/19/19, CONSULTATION DATE:  5/13 REFERRING MD:  Lupita Leash, CHIEF COMPLAINT: Diabetic ketoacidosis with acute metabolic encephalopathy  Brief History    29 year old male admitted with diabetic ketoacidosis, multiorgan failure secondary to pancreatitis from hyperlipidemia.  Started CRRT but with worsening clinical status he was intubated 5/16 with profound shock, multiorgan failure  Past Medical History   No known medical history Significant Hospital Events   5/13 Admitted with working diagnosis of DKA, administered IV fluids, insulin started .  5/14 CRRT started 5/16-intubated for worsening shock, renal failure.  Started on giapressa in addition to neo, vaso, levo and epinephrine  Consults:  Critical care, nephrology  Procedures:  Left IJ CVL 5/14 Right IJ HD catheter 5/14 Right femoral A-line 5/14 ET tube 5/16  Significant Diagnostic Tests:  Right upper quadrant ultrasound 4/13-fatty liver with no dilatation of common bile duct.  CT abdomen pelvis 5/16-severe pancreatitis and peripancreatic free fluid.  Possible early necrosis.  Marked hepatic steatosis, basilar airspace opacities with moderate effusion.  Echocardiogram 5/16-LVEF 60 to 65%, mild LVH, RV systolic function is moderately reduced.  Micro Data:  BCX2 5/13>>> CONS 2/4  Antimicrobials:   5/13 - Zosyn 5/14 -Merrem >> 5/14 vanc>> 5/17   Interim history/subjective:   History of giapressa and epi.  Continues on levo, neo and vaso  Objective   Blood pressure (!) 141/32, pulse 68, temperature (!) 94.8 F (34.9 C), resp. rate (!) 35, height 6' (1.829 m), weight (!) 171.2 kg, SpO2 99 %. CVP:  [14 mmHg-16 mmHg] 14 mmHg   Intake/Output Summary (Last 24 hours) at 12/06/2019 0820 Last data filed at 12/06/2019 0800 Gross per 24 hour  Intake 10751.23 ml  Output 6300 ml  Net 4451.23 ml   Filed Weights   12/04/19 0433 12/05/19 0500  12/06/19 0500  Weight: (!) 163.2 kg (!) 166.7 kg (!) 171.2 kg    Gen:      No acute distress, critically ill appearing HEENT:  EOMI, sclera anicteric Neck:     No masses; no thyromegaly, ETT Lungs:    Clear to auscultation bilaterally; normal respiratory effort CV:         Regular rate and rhythm; no murmurs Abd:      + bowel sounds; soft, non-tender; no palpable masses, no distension Ext:    No edema; adequate peripheral perfusion Skin:      Warm and dry; no rash Neuro: Sedated, unresponsive  Labs significant for potassium 5.7, BUN/creatinine 17/1.98 Calcium 6.2, phosphorus 6.1, lactic acid 10.7 WBC 28.2, platelets 41, hemoglobin 9.9  Resolved Hospital Problem list     Assessment & Plan:  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  No evidence of biliary issues. Continue supportive care.   Wean pressors as tolerated Stress dose steroids  Acute hypoxic respiratory failure secondary to pancreatitis ARDSnet protocol  Severe acidosis secondary to DKA, lactic acidosis Continue insulin drip, CRRT Acidosis has improved but lactic acid remains high  AKI, anuria Continue CRRT.   Acute metabolic encephalopathy Minimize sedation due to hypotension.  Elevated LFTs, thrombocytopenia, leukocytosis Secondary to shock, stress from critical illness Coag negative staph in blood is likely contaminant On empiric meropenem.  Vanco stopped as MRSA PCR is negative  Atrial fibrillation Continue amiodarone drip Holding anticoagulation due to intermittent bleeding from NG tube  Malnutrition Will place post pyloric tube today for initiation of feeds  Prognosis is guarded given multiorgan failure  and profound shock, acidosis  Best practice:  Diet: N.p.o.  Pain/Anxiety/Delirium protocol (if indicated):  VAP protocol (if indicated): Not indicated DVT prophylaxis: [place] heparin GI prophylaxis: PPI Glucose control: Insulin drip Mobility:  Bedrest for now Code Status: Full code Family Communication: Mother updated Disposition: ICU  The patient is critically ill with multiple organ system failure and requires high complexity decision making for assessment and support, frequent evaluation and titration of therapies, advanced monitoring, review of radiographic studies and interpretation of complex data.   Critical Care Time devoted to patient care services, exclusive of separately billable procedures, described in this note 80  minutes.   Chilton Greathouse MD [facility] Please see [email or web address] for pager details.  12/06/2019, 8:20 AM

## 2019-12-06 NOTE — Progress Notes (Signed)
NAME:  Edward Woods, MRN:  [id number], DOB:  Jan 10, 1991, LOS: 5 ADMISSION DATE:  2019/12/21, CONSULTATION DATE:  5/13 REFERRING MD:  Jonathon Bellows, CHIEF COMPLAINT: Diabetic ketoacidosis with acute metabolic encephalopathy  Brief History    29 year old male admitted with diabetic ketoacidosis, multiorgan failure secondary to pancreatitis from hyperlipidemia.  Started CRRT but with worsening clinical status he was intubated 5/16 with profound shock, multiorgan failure  Past Medical History   No known medical history Significant Hospital Events   5/13 Admitted with working diagnosis of DKA, administered IV fluids, insulin started .  5/14 CRRT started 5/16-intubated for worsening shock, renal failure.  Started on giapressa in addition to neo, vaso, levo and epinephrine  Consults:  Critical care, nephrology  Procedures:  Left IJ CVL 5/14 Right IJ HD catheter 5/14 Right femoral A-line 5/14 ET tube 5/16  Significant Diagnostic Tests:  Right upper quadrant ultrasound 4/13-fatty liver with no dilatation of common bile duct.  CT abdomen pelvis 5/16-severe pancreatitis and peripancreatic free fluid.  Possible early necrosis.  Marked hepatic steatosis, basilar airspace opacities with moderate effusion.  Echocardiogram 5/16-LVEF 60 to 65%, mild LVH, RV systolic function is moderately reduced.  Micro Data:  BCX2 5/13>>> CONS 2/4  Antimicrobials:   5/13 - Zosyn 5/14 -Merrem >> 5/14 vanc>> 5/17   Interim history/subjective:   History of giapressa and epi.  Continues on levo, neo and vaso  Objective   Blood pressure (!) 141/32, pulse 68, temperature (!) 94.8 F (34.9 C), resp. rate (!) 35, height 6' (1.829 m), weight (!) 171.2 kg, SpO2 99 %. CVP:  [14 mmHg-16 mmHg] 14 mmHg   Intake/Output Summary (Last 24 hours) at 12/06/2019 0820 Last data filed at 12/06/2019 0800 Gross per 24 hour  Intake 10751.23 ml  Output 6300 ml  Net 4451.23 ml   Filed Weights   12/04/19 0433 12/05/19 0500  12/06/19 0500  Weight: (!) 163.2 kg (!) 166.7 kg (!) 171.2 kg    Gen:      No acute distress, critically ill appearing HEENT:  EOMI, sclera anicteric Neck:     No masses; no thyromegaly, ETT Lungs:    Clear to auscultation bilaterally; normal respiratory effort CV:         Regular rate and rhythm; no murmurs Abd:      + bowel sounds; soft, non-tender; no palpable masses, no distension Ext:    No edema; adequate peripheral perfusion Skin:      Warm and dry; no rash Neuro: Sedated, unresponsive  Labs significant for potassium 5.7, BUN/creatinine 17/1.98 Calcium 6.2, phosphorus 6.1, lactic acid 10.7 WBC 28.2, platelets 41, hemoglobin 9.9  Resolved Hospital Problem list     Assessment & Plan:  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  No evidence of biliary issues. Continue supportive care.   Wean pressors as tolerated Stress dose steroids  Acute hypoxic respiratory failure secondary to pancreatitis ARDSnet protocol  Severe acidosis secondary to DKA, lactic acidosis Continue insulin drip, CRRT Acidosis has improved but lactic acid remains high  AKI, anuria Continue CRRT.   Acute metabolic encephalopathy Minimize sedation due to hypotension.  Elevated LFTs, thrombocytopenia, leukocytosis Secondary to shock, stress from critical illness Coag negative staph in blood is likely contaminant On empiric meropenem.  Vanco stopped as MRSA PCR is negative  Paroxysmal Atrial fibrillation > in NSR now Stop amiodarone drip Holding anticoagulation due to intermittent bleeding from NG tube  Malnutrition Will place post pyloric tube today for initiation of feeds  Prognosis  is guarded given multiorgan failure and profound shock, acidosis  Best practice:  Diet: N.p.o.  Pain/Anxiety/Delirium protocol (if indicated):  VAP protocol (if indicated): Not indicated DVT prophylaxis: [place] heparin GI prophylaxis: PPI Glucose control: Insulin  drip Mobility: Bedrest for now Code Status: Full code Family Communication: Mother updated Disposition: ICU  The patient is critically ill with multiple organ system failure and requires high complexity decision making for assessment and support, frequent evaluation and titration of therapies, advanced monitoring, review of radiographic studies and interpretation of complex data.   Critical Care Time devoted to patient care services, exclusive of separately billable procedures, described in this note 80  minutes.   Marshell Garfinkel MD [facility] Please see [email or web address] for pager details.  12/06/2019, 8:20 AM

## 2019-12-06 NOTE — Plan of Care (Signed)
  Problem: Fluid Volume: Goal: Ability to maintain a balanced intake and output will improve Outcome: Progressing   Problem: Metabolic: Goal: Ability to maintain appropriate glucose levels will improve Outcome: Progressing   Problem: Skin Integrity: Goal: Risk for impaired skin integrity will decrease Outcome: Progressing   Problem: Tissue Perfusion: Goal: Adequacy of tissue perfusion will improve Outcome: Progressing   Problem: Health Behavior/Discharge Planning: Goal: Ability to manage health-related needs will improve Outcome: Progressing   Problem: Clinical Measurements: Goal: Ability to maintain clinical measurements within normal limits will improve Outcome: Progressing Goal: Will remain free from infection Outcome: Progressing Goal: Diagnostic test results will improve Outcome: Progressing Goal: Respiratory complications will improve Outcome: Progressing Goal: Cardiovascular complication will be avoided Outcome: Progressing   Problem: Coping: Goal: Level of anxiety will decrease Outcome: Progressing   Problem: Elimination: Goal: Will not experience complications related to bowel motility Outcome: Progressing Goal: Will not experience complications related to urinary retention Outcome: Progressing   Problem: Pain Managment: Goal: General experience of comfort will improve Outcome: Progressing   Problem: Safety: Goal: Ability to remain free from injury will improve Outcome: Progressing   Problem: Skin Integrity: Goal: Risk for impaired skin integrity will decrease Outcome: Progressing   Problem: Education: Goal: Ability to describe self-care measures that may prevent or decrease complications (Diabetes Survival Skills Education) will improve Outcome: Not Progressing Goal: Individualized Educational Video(s) Outcome: Not Progressing   Problem: Coping: Goal: Ability to adjust to condition or change in health will improve Outcome: Not Progressing    Problem: Health Behavior/Discharge Planning: Goal: Ability to identify and utilize available resources and services will improve Outcome: Not Progressing Goal: Ability to manage health-related needs will improve Outcome: Not Progressing   Problem: Nutritional: Goal: Maintenance of adequate nutrition will improve Outcome: Not Progressing Goal: Progress toward achieving an optimal weight will improve Outcome: Not Progressing   Problem: Education: Goal: Knowledge of General Education information will improve Description: Including pain rating scale, medication(s)/side effects and non-pharmacologic comfort measures Outcome: Not Progressing   Problem: Activity: Goal: Risk for activity intolerance will decrease Outcome: Not Progressing   Problem: Nutrition: Goal: Adequate nutrition will be maintained Outcome: Not Progressing

## 2019-12-06 NOTE — Progress Notes (Signed)
Called by RN regarding increased vasopressor requirements.  CVP 4.  Patient on Lefophed, vasopressin, phenylephrine and amiodarone gtts.  Peripheral saturations reading in the 50's. ABG pulled with PO2 of 89 / saturation of 94%.    Plan 1L LR now  Wean vasopressors for MAP >65 Albumin 50g x1    Canary Brim, MSN, NP-C [facility] 12/06/2019, 3:46 PM   Please see [email or web address] for pager details.

## 2019-12-06 NOTE — Progress Notes (Addendum)
Called by RN regarding CBG of 32 despite D51/2NS at 175ml/hr.  Reviewed with Dr. Isaiah Serge.    Plan: -change IVF to D10 at 163ml/hr -one amp D50 now  -CBG Q1 hour until consistently > 100 -assess EKG, troponin, lactate, BMP, ECHO (note SvO2 of 48 this am)  Edward Brim, MSN, NP-C [facility] 12/06/2019, 3:50 PM   Please see [email or web address] for pager details.

## 2019-12-06 NOTE — Progress Notes (Signed)
eLink Physician-Brief Progress Note Patient Name: Edward Woods DOB: 1991-02-05 MRN: [id number]   Date of Service  12/06/2019  HPI/Events of Note  Bedside RN requesting clarification regarding order of weaning patient's pressors.  eICU Interventions  RN instructed to wean Vasopressin from 0.05 to 0.04 then subsequently concentrate on weaning Norepinephrine which is currently at 60 mcg.        Thomasene Lot Jalani Cullifer 12/06/2019, 12:07 AM

## 2019-12-06 NOTE — Progress Notes (Signed)
eLink Physician-Brief Progress Note Patient Name: Edward Woods DOB: 21-Mar-1991 MRN: [id number]   Date of Service  12/06/2019  HPI/Events of Note  Serum calcium  6.2  (corrected to 7.4)  eICU Interventions  Calcium gluconate 2 gm iv x 1        [name] [name] 12/06/2019, 10:07 PM

## 2019-12-06 NOTE — Progress Notes (Signed)
[facility] Progress Note    Subjective:    + 6.6 L yesterday.  +21 L total since admission 6 days ago.   Major ^ in pressor support required this afternoon.   Also K+ 5.7 today, and phos 8.3 yest and 6.1 today (had been 2.6 earlier)   Objective:   BP (!) 141/32   Pulse 85   Temp (!) 97.3 F (36.3 C)   Resp (!) 36   Ht 6' (1.829 m)   Wt (!) 171.2 kg   SpO2 93%   BMI 51.19 kg/m   Intake/Output Summary (Last 24 hours) at 12/06/2019 1618 Last data filed at 12/06/2019 1600 Gross per 24 hour  Intake 8425.09 ml  Output 6068 ml  Net 2357.09 ml   Weight change: 4.5 kg  Physical Exam: GEN: intubated, ill HEENT eyes closed NECK  No JVD PULM tachypneic CV tachycardic ABD soft, somewhat distended EXT diffuse edema of the extremities NEURO obtunded SKIN no rashes ACCESS: R IJ nontunneled HD cath    Assessment/ Plan:   1.  AKI: acute oliguric AKi--> continue CRRT D# 5.  Changing to 0K for prefilter IVF's due to hyperkalemia. Continue post filter  isotonic bicarb and dialysate of 4K for now. Repeat labs pending this afternoon, will change dialysate bath if K+ not getting better.  No heparin.  No clotting issues.   2.  Profound shock: acute decompensation.  Increased lactate and pressor requirement today. Pressors are increasing this afternoon.   3.  Pancreatitis: CT abd/ pelvis showing pancreatitis.  Abd [place] with fatty liver.  TG 1100--> 531.    4.  DKA: per CCM  5.  Acute respiratory failure: intubated 5/16  6.  Acute encephalopathy: CT head L frontal periventricular white matter.  Per primary  7.  Massive vol overload - up 21 L + since admit 6 days ago   Kelly Splinter, MD 12/06/2019, 4:18 PM      Imaging: DG Abd 1 View  Result Date: 12/06/2019 CLINICAL DATA:  Feeding tube placement EXAM: ABDOMEN - 1 VIEW COMPARISON:  Dec 04, 2019 FINDINGS: Feeding tube tip is in the proximal second portion of the duodenum. There is a paucity of bowel gas. No  free air evident. IMPRESSION: Feeding tube tip in proximal second portion of duodenum. Paucity of bowel gas noted, a finding that may be seen normally but also may be indicative of enteritis or a degree of ileus. Bowel obstruction not felt to be likely. No free air. Electronically Signed   By: Lowella Grip III M.D.   On: 12/06/2019 13:01   CT ABDOMEN PELVIS W CONTRAST  Result Date: 12/04/2019 CLINICAL DATA:  Sepsis. Concern for pancreatic abscess. Progressive septic shock. EXAM: CT ABDOMEN AND PELVIS WITH CONTRAST TECHNIQUE: Multidetector CT imaging of the abdomen and pelvis was performed using the standard protocol following bolus administration of intravenous contrast. CONTRAST:  176m OMNIPAQUE IOHEXOL 300 MG/ML  SOLN COMPARISON:  CT 3 days ago. FINDINGS: Lower chest: Central lines in the SVC. Bibasilar opacities with air bronchograms, favoring atelectasis. Small right pleural effusion. Hepatobiliary: Marked hepatic steatosis with areas of focal fatty sparing. High-density material in the gallbladder may represent sludge. No obvious gallstones. No biliary dilatation. Pancreas: Diffuse severe peripancreatic fat stranding and edema. Pancreatic parenchyma appears edematous and is ill-defined. Possible diminished areas enhancement involving the uncinate process and proximal body which may represent early necrosis, however no pancreatic air. Generalized peripancreatic free fluid which tracks into the lesser sac and left pericolic gutter.  No organized or drainable acute peripancreatic collection. Spleen: Grossly negative. Adrenals/Urinary Tract: Normal adrenal glands. No hydronephrosis. Absent renal excretion on delayed phase imaging. Urinary bladder is near completely empty. Stomach/Bowel: Enteric tube tip in the stomach. Minister enteric contrast reaches the mid small bowel. Few prominent proximal small bowel loops likely reactive ileus, no obstruction. Normal appendix. Majority of the colon is decompressed.  Vascular/Lymphatic: Right femoral catheter tip in the right external iliac vein. Stranding adjacent to the left iliac vessels likely iatrogenic. Normal caliber abdominal aorta. Portal vein is not well assessed on the current exam. Reproductive: Prostate is unremarkable. Other: Non organized peripancreatic free fluid which tracks into the lesser sac, left greater than right pericolic gutters and into the pelvis. There is no organized or drainable fluid collection. No free air. Musculoskeletal: There are no acute or suspicious osseous abnormalities. IMPRESSION: 1. Severe acute pancreatitis with prominent pancreatic edema and peripancreatic free fluid. Possible diminished areas of enhancement involving the uncinate process and proximal body which may represent early necrosis, however no pancreatic air. No organized or drainable peripancreatic collection. Body habitus tank coli limits detailed evaluation. 2. Marked hepatic steatosis with areas of focal fatty sparing. 3. Absent renal excretion on delayed phase imaging suggests underlying renal dysfunction. 4. Bibasilar opacities with air bronchograms, favoring atelectasis. Small right pleural effusion. Electronically Signed   By: Keith Rake M.D.   On: 12/04/2019 16:43   DG Chest Port 1 View  Result Date: 12/06/2019 CLINICAL DATA:  Respiratory failure. EXAM: PORTABLE CHEST 1 VIEW COMPARISON:  12/04/2019 FINDINGS: The endotracheal tube, NG tube, right IJ and left IJ central venous catheters are stable. Persistent low lung volumes and marked eventration of the right hemidiaphragm with overlying vascular crowding and atelectasis. No focal infiltrates or effusions. No pneumothorax. IMPRESSION: 1. Stable support apparatus. 2. Persistent low lung volumes and marked eventration of the right hemidiaphragm. Electronically Signed   By: Marijo Sanes M.D.   On: 12/06/2019 07:08    Labs: BMET Recent Labs  Lab 12/03/19 0256 12/03/19 1407 12/03/19 1500 12/03/19 1500  12/04/19 0430 12/04/19 0430 12/04/19 1119 12/04/19 1417 12/04/19 1600 12/05/19 0000 12/05/19 0406 12/05/19 1600 12/06/19 0540  NA 141  141   < > 143   < > 139   < > 136 141 141 139 141 137  136 135  K 4.3  4.4   < > 4.4   < > 4.7   < > 4.7 5.0 5.1 4.5 4.4 4.1  4.1 5.7*  CL 113*  113*   < > 108   < > 98   < > 98 100 97* 98 99 96*  95* 96*  CO2 16*  17*   < > 17*   < > 21*   < > 14* 7* 6* 8* 10* 21*  21* 18*  GLUCOSE 177*  179*   < > 203*   < > 169*   < > 177* 304* 321* 231* 221* 140*  140* 98  BUN 33*  33*   < > 30*   < > 27*   < > 26* 25* 27* 21* _0 CREATININE 5.32*  5.46*   < > 4.32*   < > 3.75*   < > 3.61* 4.40* 4.51* 3.26* 3.04* 2.45*  2.48* 1.98*  CALCIUM 7.1*  7.1*   < > 6.8*   < > 7.0*   < > 6.7* 7.4* 6.7* 5.7* 6.0* 5.8*  5.8* 6.2*  PHOS 2.6  --  5.2*  --  6.3*  --   --  9.1*  --   --  8.3* 5.6* 6.1*   < > = values in this interval not displayed.   CBC Recent Labs  Lab 11/23/2019 0641 11/23/2019 1156 12/04/19 1600 12/05/19 0406 12/05/19 1600 12/06/19 0540  WBC 20.5*   < > 27.1* 30.3* 28.3* 28.2*  NEUTROABS 16.7*  --   --   --  14.2*  --   HGB 18.6*   < > 10.1* 10.5* 10.0* 9.9*  HCT 57.4*   < > 33.3* 33.1* 30.5* 30.7*  MCV 86.7   < > 94.9 89.9 86.4 88.0  PLT 540*   < > 85* 54* 39* 41*   < > = values in this interval not displayed.    Medications:    . chlorhexidine gluconate (MEDLINE KIT)  15 mL Mouth Rinse BID  . Chlorhexidine Gluconate Cloth  6 each Topical Daily  . docusate  100 mg Oral BID  . hydrocortisone sod succinate (SOLU-CORTEF) inj  50 mg Intravenous Q6H  . insulin aspart  0-15 Units Subcutaneous Q4H  . mouth rinse  15 mL Mouth Rinse 10 times per day  . pantoprazole (PROTONIX) IV  40 mg Intravenous Q12H  . polyethylene glycol  17 g Oral Daily  . senna  1 tablet Per Tube Daily  . sodium chloride flush  10-40 mL Intracatheter Q12H

## 2019-12-07 ENCOUNTER — Inpatient Hospital Stay (HOSPITAL_COMMUNITY): Payer: Medicaid Other

## 2019-12-07 LAB — BASIC METABOLIC PANEL
Anion gap: 26 — ABNORMAL HIGH (ref 5–15)
BUN: 18 mg/dL (ref 6–20)
CO2: 16 mmol/L — ABNORMAL LOW (ref 22–32)
Calcium: 6.5 mg/dL — ABNORMAL LOW (ref 8.9–10.3)
Chloride: 91 mmol/L — ABNORMAL LOW (ref 98–111)
Creatinine, Ser: 2.05 mg/dL — ABNORMAL HIGH (ref 0.61–1.24)
GFR calc Af Amer: 50 mL/min — ABNORMAL LOW (ref >60)
GFR calc non Af Amer: 43 mL/min — ABNORMAL LOW (ref >60)
Glucose, Bld: 143 mg/dL — ABNORMAL HIGH (ref 70–99)
Potassium: 6.4 mmol/L (ref 3.5–5.1)
Sodium: 133 mmol/L — ABNORMAL LOW (ref 135–145)

## 2019-12-07 LAB — BLOOD GAS, ARTERIAL
Acid-base deficit: 8.1 mmol/L — ABNORMAL HIGH (ref 0.0–2.0)
Acid-base deficit: 1.4 mmol/L (ref 0.0–2.0)
Bicarbonate: 16.4 mmol/L — ABNORMAL LOW (ref 20.0–28.0)
Bicarbonate: 22.8 mmol/L (ref 20.0–28.0)
FIO2: 50
FIO2: 100
MECHVT: 470 mL
O2 Saturation: 96.3 %
O2 Saturation: 98.1 %
PEEP: 8 cmH2O
Patient temperature: 97.1
Patient temperature: 98.6
RATE: 34 resp/min
pCO2 arterial: 30.6 mmHg — ABNORMAL LOW (ref 32.0–48.0)
pCO2 arterial: 38.4 mmHg (ref 32.0–48.0)
pH, Arterial: 7.344 — ABNORMAL LOW (ref 7.350–7.450)
pH, Arterial: 7.392 (ref 7.350–7.450)
pO2, Arterial: 93 mmHg (ref 83.0–108.0)
pO2, Arterial: 147 mmHg — ABNORMAL HIGH (ref 83.0–108.0)

## 2019-12-07 LAB — RENAL FUNCTION PANEL
Albumin: 2.7 g/dL — ABNORMAL LOW (ref 3.5–5.0)
Albumin: 2.9 g/dL — ABNORMAL LOW (ref 3.5–5.0)
Anion gap: 28 — ABNORMAL HIGH (ref 5–15)
Anion gap: 31 — ABNORMAL HIGH (ref 5–15)
BUN: 16 mg/dL (ref 6–20)
BUN: 19 mg/dL (ref 6–20)
CO2: 17 mmol/L — ABNORMAL LOW (ref 22–32)
CO2: 20 mmol/L — ABNORMAL LOW (ref 22–32)
Calcium: 6.1 mg/dL — CL (ref 8.9–10.3)
Calcium: 5.4 mg/dL — CL (ref 8.9–10.3)
Chloride: 88 mmol/L — ABNORMAL LOW (ref 98–111)
Chloride: 80 mmol/L — ABNORMAL LOW (ref 98–111)
Creatinine, Ser: 2.12 mg/dL — ABNORMAL HIGH (ref 0.61–1.24)
Creatinine, Ser: 1.85 mg/dL — ABNORMAL HIGH (ref 0.61–1.24)
GFR calc Af Amer: 48 mL/min — ABNORMAL LOW (ref >60)
GFR calc Af Amer: 57 mL/min — ABNORMAL LOW (ref >60)
GFR calc non Af Amer: 41 mL/min — ABNORMAL LOW (ref >60)
GFR calc non Af Amer: 49 mL/min — ABNORMAL LOW (ref >60)
Glucose, Bld: 156 mg/dL — ABNORMAL HIGH (ref 70–99)
Glucose, Bld: 78 mg/dL (ref 70–99)
Phosphorus: 9.6 mg/dL — ABNORMAL HIGH (ref 2.5–4.6)
Phosphorus: 10.3 mg/dL — ABNORMAL HIGH (ref 2.5–4.6)
Potassium: 5.9 mmol/L — ABNORMAL HIGH (ref 3.5–5.1)
Potassium: 5.5 mmol/L — ABNORMAL HIGH (ref 3.5–5.1)
Sodium: 133 mmol/L — ABNORMAL LOW (ref 135–145)
Sodium: 131 mmol/L — ABNORMAL LOW (ref 135–145)

## 2019-12-07 LAB — LACTIC ACID, PLASMA
Lactic Acid, Venous: >11 mmol/L (ref 0.5–1.9)
Lactic Acid, Venous: >11 mmol/L (ref 0.5–1.9)

## 2019-12-07 LAB — GLUCOSE, CAPILLARY
Glucose-Capillary: 102 mg/dL — ABNORMAL HIGH (ref 70–99)
Glucose-Capillary: 121 mg/dL — ABNORMAL HIGH (ref 70–99)
Glucose-Capillary: 142 mg/dL — ABNORMAL HIGH (ref 70–99)
Glucose-Capillary: 167 mg/dL — ABNORMAL HIGH (ref 70–99)
Glucose-Capillary: 51 mg/dL — ABNORMAL LOW (ref 70–99)
Glucose-Capillary: 68 mg/dL — ABNORMAL LOW (ref 70–99)
Glucose-Capillary: 73 mg/dL (ref 70–99)
Glucose-Capillary: 73 mg/dL (ref 70–99)
Glucose-Capillary: 94 mg/dL (ref 70–99)

## 2019-12-07 LAB — POCT I-STAT, CHEM 8
BUN: 13 mg/dL (ref 6–20)
Calcium, Ion: 0.59 mmol/L — CL (ref 1.15–1.40)
Chloride: 80 mmol/L — ABNORMAL LOW (ref 98–111)
Creatinine, Ser: 2.8 mg/dL — ABNORMAL HIGH (ref 0.61–1.24)
Glucose, Bld: 66 mg/dL — ABNORMAL LOW (ref 70–99)
HCT: 27 % — ABNORMAL LOW (ref 39.0–52.0)
Hemoglobin: 9.2 g/dL — ABNORMAL LOW (ref 13.0–17.0)
Potassium: 4.9 mmol/L (ref 3.5–5.1)
Sodium: 129 mmol/L — ABNORMAL LOW (ref 135–145)
TCO2: 24 mmol/L (ref 22–32)

## 2019-12-07 LAB — COMPREHENSIVE METABOLIC PANEL
ALT: 3938 U/L — ABNORMAL HIGH (ref 0–44)
AST: >10000 U/L — ABNORMAL HIGH (ref 15–41)
Albumin: 2.7 g/dL — ABNORMAL LOW (ref 3.5–5.0)
Alkaline Phosphatase: 1138 U/L — ABNORMAL HIGH (ref 38–126)
Anion gap: 29 — ABNORMAL HIGH (ref 5–15)
BUN: 17 mg/dL (ref 6–20)
CO2: 16 mmol/L — ABNORMAL LOW (ref 22–32)
Calcium: 6.2 mg/dL — CL (ref 8.9–10.3)
Chloride: 88 mmol/L — ABNORMAL LOW (ref 98–111)
Creatinine, Ser: 2.11 mg/dL — ABNORMAL HIGH (ref 0.61–1.24)
GFR calc Af Amer: 48 mL/min — ABNORMAL LOW (ref >60)
GFR calc non Af Amer: 42 mL/min — ABNORMAL LOW (ref >60)
Glucose, Bld: 154 mg/dL — ABNORMAL HIGH (ref 70–99)
Potassium: 5.9 mmol/L — ABNORMAL HIGH (ref 3.5–5.1)
Sodium: 133 mmol/L — ABNORMAL LOW (ref 135–145)
Total Bilirubin: 9.7 mg/dL — ABNORMAL HIGH (ref 0.3–1.2)
Total Protein: 4.8 g/dL — ABNORMAL LOW (ref 6.5–8.1)

## 2019-12-07 LAB — CBC
HCT: 27.6 % — ABNORMAL LOW (ref 39.0–52.0)
Hemoglobin: 8.8 g/dL — ABNORMAL LOW (ref 13.0–17.0)
MCH: 29.3 pg (ref 26.0–34.0)
MCHC: 31.9 g/dL (ref 30.0–36.0)
MCV: 92 fL (ref 80.0–100.0)
Platelets: 55 10*3/uL — ABNORMAL LOW (ref 150–400)
RBC: 3 MIL/uL — ABNORMAL LOW (ref 4.22–5.81)
RDW: 17.3 % — ABNORMAL HIGH (ref 11.5–15.5)
WBC: 39.5 10*3/uL — ABNORMAL HIGH (ref 4.0–10.5)
nRBC: 11.7 % — ABNORMAL HIGH (ref 0.0–0.2)

## 2019-12-07 LAB — BLOOD GAS, VENOUS
Acid-base deficit: 5.7 mmol/L — ABNORMAL HIGH (ref 0.0–2.0)
Bicarbonate: 19.7 mmol/L — ABNORMAL LOW (ref 20.0–28.0)
O2 Saturation: 52.6 %
Patient temperature: 98.6
pCO2, Ven: 41 mmHg — ABNORMAL LOW (ref 44.0–60.0)
pH, Ven: 7.303 (ref 7.250–7.430)
pO2, Ven: 37.7 mmHg (ref 32.0–45.0)

## 2019-12-07 LAB — AMMONIA: Ammonia: 91 umol/L — ABNORMAL HIGH (ref 9–35)

## 2019-12-07 LAB — MAGNESIUM: Magnesium: 2.6 mg/dL — ABNORMAL HIGH (ref 1.7–2.4)

## 2019-12-07 MED ORDER — CALCIUM GLUCONATE-NACL 2-0.675 GM/100ML-% IV SOLN
2.0000 g | Freq: Once | INTRAVENOUS | Status: AC
  Administered 2019-12-07: 2000 mg via INTRAVENOUS
  Filled 2019-12-07: qty 100

## 2019-12-07 MED ORDER — ADULT MULTIVITAMIN LIQUID CH
15.0000 mL | Freq: Every day | ORAL | Status: DC
  Administered 2019-12-07: 15 mL
  Filled 2019-12-07: qty 15

## 2019-12-07 MED ORDER — CALCIUM GLUCONATE 10 % IV SOLN: 1.0000 g | Freq: Once | INTRAVENOUS | Status: DC

## 2019-12-07 MED ORDER — EPINEPHRINE 1 MG/10ML IJ SOSY
0.3000 mg | PREFILLED_SYRINGE | Freq: Once | INTRAMUSCULAR | Status: AC
  Administered 2019-12-07: 0.3 mg via INTRAVENOUS

## 2019-12-07 MED ORDER — DEXTROSE 50 % IV SOLN
INTRAVENOUS | Status: AC
  Filled 2019-12-07: qty 50

## 2019-12-07 MED ORDER — SODIUM BICARBONATE 8.4 % IV SOLN
50.0000 meq | Freq: Once | INTRAVENOUS | Status: AC
  Administered 2019-12-07: 50 meq via INTRAVENOUS

## 2019-12-07 MED ORDER — EPINEPHRINE HCL 5 MG/250ML IV SOLN IN NS
0.5000 ug/min | INTRAVENOUS | Status: DC
  Administered 2019-12-08: 10 ug/min via INTRAVENOUS
  Filled 2019-12-07 (×2): qty 250

## 2019-12-07 MED ORDER — DEXTROSE 10 % IV SOLN: INTRAVENOUS | Status: DC

## 2019-12-07 MED ORDER — ALBUMIN HUMAN 5 % IV SOLN
50.0000 g | Freq: Once | INTRAVENOUS | Status: AC
  Administered 2019-12-07: 50 g via INTRAVENOUS

## 2019-12-07 MED ORDER — EPINEPHRINE 1 MG/10ML IJ SOSY
0.1000 mg | PREFILLED_SYRINGE | INTRAMUSCULAR | Status: DC | PRN
  Administered 2019-12-07 (×3): 0.2 mg via INTRAVENOUS

## 2019-12-07 MED ORDER — SODIUM CHLORIDE 0.9 % IV SOLN: 1.2500 ng/kg/min | INTRAVENOUS | Status: DC

## 2019-12-07 MED ORDER — ALBUMIN HUMAN 5 % IV SOLN
25.0000 g | Freq: Once | INTRAVENOUS | Status: AC
  Administered 2019-12-07: 25 g via INTRAVENOUS
  Filled 2019-12-07: qty 500

## 2019-12-07 MED ORDER — STERILE WATER FOR INJECTION IV SOLN
INTRAVENOUS | Status: DC
  Filled 2019-12-07 (×13): qty 150

## 2019-12-07 MED ORDER — DEXTROSE 50 % IV SOLN
25.0000 g | Freq: Once | INTRAVENOUS | Status: AC
  Administered 2019-12-07: 25 g via INTRAVENOUS

## 2019-12-07 MED ORDER — PRO-STAT SUGAR FREE PO LIQD
60.0000 mL | Freq: Three times a day (TID) | ORAL | Status: DC
  Administered 2019-12-07 (×3): 60 mL
  Filled 2019-12-07 (×3): qty 60

## 2019-12-07 MED ORDER — DEXTROSE 50 % IV SOLN
INTRAVENOUS | Status: AC
  Administered 2019-12-07: 25 mL
  Filled 2019-12-07: qty 50

## 2019-12-07 MED ORDER — SODIUM CHLORIDE 0.9 % IV SOLN
2.0000 g | Freq: Once | INTRAVENOUS | Status: AC
  Administered 2019-12-07: 2 g via INTRAVENOUS
  Filled 2019-12-07: qty 20

## 2019-12-07 MED ORDER — SODIUM CHLORIDE 0.9 % IV SOLN
4.0000 g | Freq: Once | INTRAVENOUS | Status: AC
  Administered 2019-12-07: 4 g via INTRAVENOUS
  Filled 2019-12-07: qty 40

## 2019-12-07 MED ORDER — SODIUM CHLORIDE 0.9 % IV SOLN
1.2500 ng/kg/min | INTRAVENOUS | Status: DC
  Administered 2019-12-07: 5 ng/kg/min via INTRAVENOUS
  Filled 2019-12-07 (×2): qty 1

## 2019-12-07 MED ORDER — CALCIUM CHLORIDE 10 % IV SOLN
1.0000 g | Freq: Once | INTRAVENOUS | Status: AC
  Administered 2019-12-07: 1 g via INTRAVENOUS
  Filled 2019-12-07: qty 10

## 2019-12-07 MED ORDER — CALCIUM GLUCONATE-NACL 1-0.675 GM/50ML-% IV SOLN
1.0000 g | Freq: Once | INTRAVENOUS | Status: AC
  Administered 2019-12-07: 1000 mg via INTRAVENOUS
  Filled 2019-12-07: qty 50

## 2019-12-07 NOTE — Progress Notes (Signed)
Per Dr. Isaiah Serge, check abdominal pressures q Shift. See care order

## 2019-12-07 NOTE — Progress Notes (Signed)
Arterial Blood Gas drawn by RN on the following vent settings and sample sent to the lab for analysis: PRVC f 34 Vt .470 FiO2 1.0 PEEP 8

## 2019-12-07 NOTE — Progress Notes (Signed)
eLink Physician-Brief Progress Note Patient Name: [name] DOB: [birth year] MRN: [id number]   Date of Service  12/07/2019  HPI/Events of Note  Lactate  > 11, Ca++ 6.1 CTAP without evidence of overt necrosis.  eICU Interventions  Albumin 5 % 1000 ml iv fluid bolus , Calcium gluconate 2 gm iv x 1        Rashaunda Rahl U Taleia Sadowski 12/07/2019, 5:59 AM

## 2019-12-07 NOTE — Progress Notes (Signed)
Notified Passenger transport manager at [facility] that k has decreased from 6.8 to 6.4 following dialysate changes and lokelma. BMP already ordered for the morning.

## 2019-12-07 NOTE — Progress Notes (Signed)
[facility] Progress Note    Subjective:   + 4.6 L yesterday, total + 32 L since admission CVP up and down, 10- 20 range FiO2 = 50% Neo, vaso, levo gtt's   Objective:   BP (!) 141/32   Pulse 85   Temp (!) 97.3 F (36.3 C)   Resp (!) 36   Ht 6' (1.829 m)   Wt (!) 171.2 kg   SpO2 93%   BMI 51.19 kg/m   Intake/Output Summary (Last 24 hours) at 12/06/2019 1618 Last data filed at 12/06/2019 1600 Gross per 24 hour  Intake 8425.09 ml  Output 6068 ml  Net 2357.09 ml   Weight change: 4.5 kg  Physical Exam: GEN: intubated, ill, not responsive HEENT eyes closed NECK  No JVD PULM on vent, coarse BS bilat CV tachycardic ABD soft, somewhat distended EXT diffuse edema of the extremities + mottling of bilat LE's w/ blistering NEURO obtunded ACCESS: R IJ nontunneled HD cath    Assessment/ Plan:   1.  AKI: acute oliguric AKi--> CRRT started 5/14.  Hyperkalemia acutely yest 5/18, better now is on zero K+ dialysate and getting now K+ pre/ post fluids. Met acidosis worsened as well, no have changed to both pre/post bicarb gtt's. K+ down this am mid 5's range. Cont CRRT  2.  Profound shock: acute decompensation.  Increased lactate and pressor requirements. 3rd spacing all IVF"s. Mottling of bilat LE's w/ blistering.   3.  Pancreatitis: CT abd/ pelvis showing pancreatitis.  Abd [place] with fatty liver.  TG 1100--> 531.  Repeat abd CT yest 5/18 did not show any bowel / pancreatic necrosis.   4.  DKA: per CCM  5.  Acute respiratory failure: intubated 5/16  6.  Acute encephalopathy: CT head L frontal periventricular white matter.  Per primary  7.  Vol overload - CVP 10-20, sig 3rd spacing d/t shock   Kelly Splinter, MD 12/07/2019, 10:27 AM      Imaging: CT ABDOMEN PELVIS WO CONTRAST  Result Date: 12/06/2019 CLINICAL DATA:  Respiratory failure. Acute pancreatitis with elevated lactic acid and worsening shock. EXAM: CT CHEST, ABDOMEN AND PELVIS WITHOUT CONTRAST  TECHNIQUE: Multidetector CT imaging of the chest, abdomen and pelvis was performed following the standard protocol without IV contrast. COMPARISON:  Abdominal CT 2 days ago, as well as 12/15/2019. FINDINGS: CT CHEST FINDINGS Cardiovascular: Heart is normal in size. Thoracic aorta is normal in caliber. Two central lines in place with tips in the SVC. No pericardial effusion. Mediastinum/Nodes: No mediastinal or bulky hilar adenopathy. Enteric tube decompresses the esophagus. Esophagus is patulous. Endotracheal tube tip above the carina. No suspicious thyroid nodule. Lungs/Pleura: Right lung base opacity, favor compressive atelectasis. Minor left lower lobe atelectasis. Possible small right pleural effusion. No pulmonary edema. Musculoskeletal: There are no acute or suspicious osseous abnormalities. CT ABDOMEN PELVIS FINDINGS Hepatobiliary: Advanced steatosis. Decreasing high density material in the gallbladder likely resolving vicarious excretion of contrast. No pericholecystic inflammation. Pancreas: Again seen acute pancreatitis with prominent diffuse peripancreatic inflammatory change and edema. Ill-defined peripancreatic stranding. No definite pancreatic air/necrosis. There is air in adjacent small bowel that is not felt to be within the pancreas. There is no definite drainable peripancreatic fluid collection. Spleen: Normal in size without focal abnormality. Adrenals/Urinary Tract: Normal adrenal glands. No hydronephrosis. Decompressed urinary bladder by Foley catheter. Stomach/Bowel: Enteric tube in place. Air-fluid level in the stomach which is mildly distended no evidence of bowel obstruction. Normal appendix. Decompressed colon. Rectal tube in place. No mesenteric  edema. Vascular/Lymphatic: Abdominal aorta is normal in caliber. Right femoral catheter tip in the common femoral vein. No gross bulky adenopathy. Reproductive: Prostate is unremarkable. Other: Peripancreatic fat stranding and edema, with free  fluid tracking into the pericolic gutters and pelvis. There is no evidence of free air. Musculoskeletal: There are no acute or suspicious osseous abnormalities. IMPRESSION: 1. Severe acute pancreatitis again seen. There is no evidence of pancreatic necrosis. No obvious drainable focal peripancreatic fluid collection. No new abnormality in the abdomen/pelvis. 2. Right lung base opacity, favor compressive atelectasis. 3. Advanced hepatic steatosis. Electronically Signed   By: Keith Rake M.D.   On: 12/06/2019 19:39   DG Abd 1 View  Result Date: 12/06/2019 CLINICAL DATA:  Feeding tube placement EXAM: ABDOMEN - 1 VIEW COMPARISON:  Dec 04, 2019 FINDINGS: Feeding tube tip is in the proximal second portion of the duodenum. There is a paucity of bowel gas. No free air evident. IMPRESSION: Feeding tube tip in proximal second portion of duodenum. Paucity of bowel gas noted, a finding that may be seen normally but also may be indicative of enteritis or a degree of ileus. Bowel obstruction not felt to be likely. No free air. Electronically Signed   By: Lowella Grip III M.D.   On: 12/06/2019 13:01   CT HEAD WO CONTRAST  Result Date: 12/06/2019 CLINICAL DATA:  Diabetic ketoacidosis. EXAM: CT HEAD WITHOUT CONTRAST TECHNIQUE: Contiguous axial images were obtained from the base of the skull through the vertex without intravenous contrast. COMPARISON:  Dec 01, 2019 FINDINGS: Brain: No evidence of acute infarction, hemorrhage, hydrocephalus, extra-axial collection or mass lesion/mass effect. Vascular: No hyperdense vessel or unexpected calcification. Skull: Normal. Negative for fracture or focal lesion. Sinuses/Orbits: No acute finding. Other: Endotracheal and nasogastric tubes are seen. IMPRESSION: No acute intracranial abnormality. Electronically Signed   By: Virgina Norfolk M.D.   On: 12/06/2019 19:13   CT CHEST WO CONTRAST  Result Date: 12/06/2019 CLINICAL DATA:  Respiratory failure. Acute pancreatitis with  elevated lactic acid and worsening shock. EXAM: CT CHEST, ABDOMEN AND PELVIS WITHOUT CONTRAST TECHNIQUE: Multidetector CT imaging of the chest, abdomen and pelvis was performed following the standard protocol without IV contrast. COMPARISON:  Abdominal CT 2 days ago, as well as 12/03/2019. FINDINGS: CT CHEST FINDINGS Cardiovascular: Heart is normal in size. Thoracic aorta is normal in caliber. Two central lines in place with tips in the SVC. No pericardial effusion. Mediastinum/Nodes: No mediastinal or bulky hilar adenopathy. Enteric tube decompresses the esophagus. Esophagus is patulous. Endotracheal tube tip above the carina. No suspicious thyroid nodule. Lungs/Pleura: Right lung base opacity, favor compressive atelectasis. Minor left lower lobe atelectasis. Possible small right pleural effusion. No pulmonary edema. Musculoskeletal: There are no acute or suspicious osseous abnormalities. CT ABDOMEN PELVIS FINDINGS Hepatobiliary: Advanced steatosis. Decreasing high density material in the gallbladder likely resolving vicarious excretion of contrast. No pericholecystic inflammation. Pancreas: Again seen acute pancreatitis with prominent diffuse peripancreatic inflammatory change and edema. Ill-defined peripancreatic stranding. No definite pancreatic air/necrosis. There is air in adjacent small bowel that is not felt to be within the pancreas. There is no definite drainable peripancreatic fluid collection. Spleen: Normal in size without focal abnormality. Adrenals/Urinary Tract: Normal adrenal glands. No hydronephrosis. Decompressed urinary bladder by Foley catheter. Stomach/Bowel: Enteric tube in place. Air-fluid level in the stomach which is mildly distended no evidence of bowel obstruction. Normal appendix. Decompressed colon. Rectal tube in place. No mesenteric edema. Vascular/Lymphatic: Abdominal aorta is normal in caliber. Right femoral catheter tip in the  common femoral vein. No gross bulky adenopathy.  Reproductive: Prostate is unremarkable. Other: Peripancreatic fat stranding and edema, with free fluid tracking into the pericolic gutters and pelvis. There is no evidence of free air. Musculoskeletal: There are no acute or suspicious osseous abnormalities. IMPRESSION: 1. Severe acute pancreatitis again seen. There is no evidence of pancreatic necrosis. No obvious drainable focal peripancreatic fluid collection. No new abnormality in the abdomen/pelvis. 2. Right lung base opacity, favor compressive atelectasis. 3. Advanced hepatic steatosis. Electronically Signed   By: Keith Rake M.D.   On: 12/06/2019 19:39   DG Chest Port 1 View  Result Date: 12/07/2019 CLINICAL DATA:  Respiratory failure. EXAM: PORTABLE CHEST 1 VIEW COMPARISON:  12/06/2019 FINDINGS: Endotracheal tube has tip 3.6 cm above the carina. Right IJ central venous catheter unchanged. Left IJ central venous catheter unchanged. Enteric tube courses into the region of the gastroesophageal junction and off the film as tip is not visualized. Lungs continue to be hypoinflated with slight elevation of the right hemidiaphragm is suggestion of mild right basilar atelectasis. Cardiomediastinal silhouette and remainder the exam is unchanged. IMPRESSION: 1. Continued hypoinflation with suggestion of mild right basilar atelectasis. 2.  Tubes and lines as described. Electronically Signed   By: Marin Olp M.D.   On: 12/07/2019 07:22   DG Chest Port 1 View  Result Date: 12/06/2019 CLINICAL DATA:  Respiratory failure. EXAM: PORTABLE CHEST 1 VIEW COMPARISON:  12/04/2019 FINDINGS: The endotracheal tube, NG tube, right IJ and left IJ central venous catheters are stable. Persistent low lung volumes and marked eventration of the right hemidiaphragm with overlying vascular crowding and atelectasis. No focal infiltrates or effusions. No pneumothorax. IMPRESSION: 1. Stable support apparatus. 2. Persistent low lung volumes and marked eventration of the right  hemidiaphragm. Electronically Signed   By: Marijo Sanes M.D.   On: 12/06/2019 07:08   ECHOCARDIOGRAM LIMITED  Result Date: 12/06/2019    ECHOCARDIOGRAM LIMITED REPORT   Patient Name:   Edward Woods Date of Exam: 12/06/2019 Medical Rec #:  [id number]           Height:       72.0 in Accession #:    [id number]          Weight:       377.4 lb Date of Birth:  05-08-1991            BSA:          2.791 m Patient Age:    29 years            BP:           141/32 mmHg Patient Gender: M                   HR:           81 bpm. Exam Location:  Inpatient Procedure: 2D Echo Indications:    acute respiratory insufficiency  History:        Patient has prior history of Echocardiogram examinations, most                 recent 12/04/2019. No prior cardiac hx on file.  Sonographer:    Jannett Celestine RDCS (AE) Referring Phys: [id number] Donita Brooks  Sonographer Comments: Suboptimal apical window, Technically difficult study due to poor echo windows, no parasternal window, suboptimal subcostal window, patient is morbidly obese and echo performed with patient supine and on artificial respirator. Image  acquisition challenging due to patient body habitus and Image  acquisition challenging due to respiratory motion. IMPRESSIONS  1. Very technically difficult study. Definity contrast given.  2. Left ventricular ejection fraction, by estimation, is 70 to 75%. The left ventricle has hyperdynamic function.  3. Right ventricular systolic function is moderately reduced. The right ventricular size is mildly enlarged. Comparison(s): Changes from prior study are noted. 12/04/2019: LVEF 60-65%, moderate RV systolic dysfunction. Conclusion(s)/Recommendation(s): Very technically difficult study. With contrast, the LVEF is hyperdynamic. I suspect there is moderate RV systolic dysfunction, however, the RV is not entirely visualized. FINDINGS  Left Ventricle: Left ventricular ejection fraction, by estimation, is 70 to 75%. The left ventricle has  hyperdynamic function. Definity contrast agent was given IV to delineate the left ventricular endocardial borders. Right Ventricle: The right ventricular size is mildly enlarged. Right ventricular systolic function is moderately reduced. Aortic Valve: Aortic valve mean gradient measures 6.0 mmHg. Aortic valve peak gradient measures 17.1 mmHg.  AORTIC VALVE AV Vmax:           207.00 cm/s AV Vmean:          110.000 cm/s AV VTI:            0.206 m AV Peak Grad:      17.1 mmHg AV Mean Grad:      6.0 mmHg LVOT Vmax:         100.00 cm/s LVOT Vmean:        44.600 cm/s LVOT VTI:          0.091 m LVOT/AV VTI ratio: 0.44 MITRAL VALVE MV Area (PHT): 1.89 cm    SHUNTS MV Decel Time: 401 msec    Systemic VTI: 0.09 m MV E velocity: 61.30 cm/s MV A velocity: 46.30 cm/s MV E/A ratio:  1.32 Lyman Bishop MD Electronically signed by Lyman Bishop MD Signature Date/Time: 12/06/2019/5:44:13 PM    Final     Labs: BMET Recent Labs  Lab 12/04/19 0430 12/04/19 1119 12/04/19 1417 12/04/19 1600 12/05/19 0406 12/05/19 1600 12/06/19 0540 12/06/19 1520 12/06/19 2013 12/07/19 0000 12/07/19 0430  NA 139   < > 141   < > 141 137  136 135 131*  131* 131* 133* 133*  133*  K 4.7   < > 5.0   < > 4.4 4.1  4.1 5.7* 6.4*  6.4* 6.8* 6.4* 5.9*  5.9*  CL 98   < > 100   < > 99 96*  95* 96* 95*  95* 93* 91* 88*  88*  CO2 21*   < > 7*   < > 10* 21*  21* 18* 12*  14* 12* 16* 16*  17*  GLUCOSE 169*   < > 304*   < > 221* 140*  140* 98 367*  367* 146* 143* 154*  156*  BUN 27*   < > 25*   < > '20 18  18 17 16  15 19 18 17  16  ' CREATININE 3.75*   < > 4.40*   < > 3.04* 2.45*  2.48* 1.98* 2.05*  2.06* 2.51* 2.05* 2.11*  2.12*  CALCIUM 7.0*   < > 7.4*   < > 6.0* 5.8*  5.8* 6.2* 6.2*  6.2* 6.3* 6.5* 6.2*  6.1*  PHOS 6.3*  --  9.1*  --  8.3* 5.6* 6.1* 8.4*  --   --  9.6*   < > = values in this interval not displayed.   CBC Recent Labs  Lab 11/19/2019 0641 11/21/2019 1156 12/05/19 0406 12/05/19 1600 12/06/19 0540  12/07/19 0430  WBC 20.5*   < > 30.3* 28.3* 28.2* 39.5*  NEUTROABS 16.7*  --   --  14.2*  --   --   HGB 18.6*   < > 10.5* 10.0* 9.9* 8.8*  HCT 57.4*   < > 33.1* 30.5* 30.7* 27.6*  MCV 86.7   < > 89.9 86.4 88.0 92.0  PLT 540*   < > 54* 39* 41* 55*   < > = values in this interval not displayed.    Medications:    . chlorhexidine gluconate (MEDLINE KIT)  15 mL Mouth Rinse BID  . Chlorhexidine Gluconate Cloth  6 each Topical Daily  . docusate  100 mg Oral BID  . hydrocortisone sod succinate (SOLU-CORTEF) inj  50 mg Intravenous Q6H  . insulin aspart  0-15 Units Subcutaneous Q4H  . mouth rinse  15 mL Mouth Rinse 10 times per day  . pantoprazole (PROTONIX) IV  40 mg Intravenous Q12H  . polyethylene glycol  17 g Oral Daily  . senna  1 tablet Per Tube Daily  . sodium chloride flush  10-40 mL Intracatheter Q12H  . sodium zirconium cyclosilicate  10 g Per Tube Daily

## 2019-12-07 NOTE — Progress Notes (Signed)
NUTRITION NOTE  Patient discussed in rounds this AM. Patient remains intubated with small bore NGT placed yesterday (gastric placement). Patient is receiving Pivot 1.5 at goal rate of 30 ml/hr.  In order to reach protein goal, will need 60 ml prostat x6/day (12 packets/day). Able to talk with Dr. Isaiah Serge about patient. Patient is +30L since admission with compartment syndrome/third spacing present.   Continue Pivot 1.5 @ 30 ml/hr and will add 60 ml prostat TID at this time. This regimen will provide 1680 kcal (92% estimated kcal need) and 157 grams protein (65% estimated protein need).  Estimated Nutritional Needs:  Kcal:  8756-4332 kcal Protein:  >/= 242 grams Fluid:  >/= 1.8 L/day    Trenton Gammon, MS, RD, LDN, CNSC Inpatient Clinical Dietitian RD pager # available in [facility]  After hours/weekend pager # available in [facility]

## 2019-12-07 NOTE — Progress Notes (Addendum)
CBG checked at 1800 resulted at 68, 1/2 amp given and CBG recheck was 121.  Pt was symptomatic with low BP  D10 also increased to 20 mL/ hr

## 2019-12-07 NOTE — Progress Notes (Signed)
NAME:  Edward Woods, MRN:  [id number], DOB:  [birth year], LOS: 6 ADMISSION DATE:  11/24/2019, CONSULTATION DATE:  5/13 REFERRING MD:  Lupita Leash, CHIEF COMPLAINT: Diabetic ketoacidosis with acute metabolic encephalopathy  Brief History    29 year old male admitted with diabetic ketoacidosis, multiorgan failure secondary to pancreatitis from hyperlipidemia.  Started CRRT but with worsening clinical status he was intubated 5/16 with profound shock, multiorgan failure  Past Medical History   No known medical history Significant Hospital Events   5/13 Admitted with working diagnosis of DKA, administered IV fluids, insulin started .  5/14 CRRT started 5/16-intubated for worsening shock, renal failure.  Started on giapressa in addition to neo, vaso, levo and epinephrine  Consults:  Critical care, nephrology  Procedures:  Left IJ CVL 5/14 Right IJ HD catheter 5/14 Right femoral A-line 5/14 ET tube 5/16  Significant Diagnostic Tests:  Right upper quadrant ultrasound 4/13-fatty liver with no dilatation of common bile duct.  CT abdomen pelvis 5/16-severe pancreatitis and peripancreatic free fluid.  Possible early necrosis.  Marked hepatic steatosis, basilar airspace opacities with moderate effusion.  Echocardiogram 5/16-LVEF 60 to 65%, mild LVH, RV systolic function is moderately reduced.  CT head, chest, abdomen pelvis 5/18-no acute abnormalities in the head.  Right base opacity with minimal pleural effusion.  Acute pancreatitis with no definite necrosis.  Echo 5/18-LVEF 00-17%, RV systolic function is moderately reduced, RV size is mildly enlarged.   Micro Data:  BCX2 5/13>>> CONS 2/4  Antimicrobials:   5/13 - Zosyn 5/14 -Merrem >> 5/14 vanc>> 5/17   Interim history/subjective:   Continues to be in profound shock with lactic acidosis On neo-, vaso-, vasopressin Elevated potassium slightly improved with Lokelma.  Objective   Blood pressure (!) 97/46, pulse 88, temperature  98.4 F (36.9 C), resp. rate (!) 35, height 6' (1.829 m), weight (!) 175.9 kg, SpO2 97 %. CVP:  [7 mmHg-11 mmHg] 7 mmHg   Intake/Output Summary (Last 24 hours) at 12/07/2019 4944 Last data filed at 12/07/2019 0800 Gross per 24 hour  Intake 9407.06 ml  Output 2379 ml  Net 7028.06 ml   Filed Weights   12/05/19 0500 12/06/19 0500 12/07/19 0500  Weight: (!) 166.7 kg (!) 171.2 kg (!) 175.9 kg    Gen:      No acute distress, obese HEENT:  EOMI, sclera anicteric Neck:     No masses; no thyromegaly Lungs:    Clear to auscultation bilaterally; normal respiratory effort CV:         Regular rate and rhythm; no murmurs Abd:      + bowel sounds; soft, non-tender; no palpable masses, no distension Ext:    Mottled extremities with blistering; adequate peripheral perfusion Skin:      Warm and dry; no rash Neuro: Sedated  Labs significant for BUN/creatinine 17/2.11, alk phos 1138, AST greater than 10,000, ALT 3938, total bilirubin 9.7 lactic acid greater than 11 WBC 39.5, platelets 55  Resolved Hospital Problem list     Assessment & Plan:  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  No evidence of biliary issues. Continue supportive care.   Wean pressors as tolerated Stress dose steroids  Acute hypoxic respiratory failure secondary to pancreatitis 6cc/kg TV. High risk for developing ARDS  Severe acidosis secondary to lactic acidosis DKA resolved > now he is hypoglycemic Acidosis has improved but lactic acid remains high No evidence of any infarction and necrosis on repeat CT abdomen SSI, D10 drip  AKI, anuria Continue CRRT.  Fluid status Grossly volume up but intravascularly depleted due to third spacing Use IV albumin, advance tube feeds Monitor CVP, vent and bladder pressures to make sure is not developing compartment syndrome  Acute metabolic encephalopathy Minimize sedation due to hypotension. CT head is  unremarkable.  Elevated LFTs, thrombocytopenia, leukocytosis Secondary to shock, stress from critical illness Coag negative staph in blood is likely contaminant On empiric meropenem.  Vanco stopped as MRSA PCR is negative  Paroxysmal Atrial fibrillation > in NSR now Stop amiodarone drip Holding anticoagulation due to intermittent bleeding from NG tube  Malnutrition Advance tube feeds via postpyloric tube.  Prognosis is guarded given multiorgan failure and profound shock, acidosis  Best practice:  Diet: Tube feeds Pain/Anxiety/Delirium protocol (if indicated): Fentanyl drip VAP protocol (if indicated): Ordered DVT prophylaxis: Blanco heparin GI prophylaxis: PPI Glucose control: Insulin drip Mobility: Bedrest for now Code Status: Full code Family Communication: Parents updated. Disposition: ICU  The patient is critically ill with multiple organ system failure and requires high complexity decision making for assessment and support, frequent evaluation and titration of therapies, advanced monitoring, review of radiographic studies and interpretation of complex data.   Critical Care Time devoted to patient care services, exclusive of separately billable procedures, described in this note 90 minutes.   Marshell Garfinkel MD [facility] Please see [email or web address] for pager details.  12/07/2019, 8:12 AM

## 2019-12-07 NOTE — Progress Notes (Signed)
CRITICAL VALUE ALERT  Critical Value:  Ca 5.4  Date & Time Notied:  12/07/19 1700  Provider Notified: Dr. Isaiah Serge  Orders Received/Actions taken: 4g replacement ordered

## 2019-12-07 NOTE — Progress Notes (Signed)
Notified elink Sheria Lang and Dr. Abel Presto of k of 6.8 and ca of 6.2. Dialysate was changed from 4/2.5 to 0/2.5. Lokelma and IVPB calcium was also order. Will recheck bmp at midnight.

## 2019-12-07 NOTE — Progress Notes (Signed)
eLink Physician-Brief Progress Note Patient Name: Edward Woods DOB: [birth year] MRN: [id number]   Date of Service  12/07/2019  HPI/Events of Note  Repeat ionized calcium 0.65.  eICU Interventions  Calcium chloride 2 gm iv bolus x 1, repeat istat 7 at 3 a.m.        Thomasene Lot Edward Woods 12/07/2019, 10:54 PM

## 2019-12-08 ENCOUNTER — Inpatient Hospital Stay (HOSPITAL_COMMUNITY): Payer: Medicaid Other

## 2019-12-08 LAB — BLOOD GAS, ARTERIAL
Acid-base deficit: 11 mmol/L — ABNORMAL HIGH (ref 0.0–2.0)
Bicarbonate: 15 mmol/L — ABNORMAL LOW (ref 20.0–28.0)
FIO2: 70
O2 Saturation: 94.4 %
Patient temperature: 98.6
pCO2 arterial: 35.9 mmHg (ref 32.0–48.0)
pH, Arterial: 7.244 — ABNORMAL LOW (ref 7.350–7.450)
pO2, Arterial: 97.1 mmHg (ref 83.0–108.0)

## 2019-12-08 LAB — POCT I-STAT, CHEM 8
BUN: 14 mg/dL (ref 6–20)
BUN: 14 mg/dL (ref 6–20)
Calcium, Ion: 0.65 mmol/L — CL (ref 1.15–1.40)
Calcium, Ion: 0.73 mmol/L — CL (ref 1.15–1.40)
Chloride: 81 mmol/L — ABNORMAL LOW (ref 98–111)
Chloride: 83 mmol/L — ABNORMAL LOW (ref 98–111)
Creatinine, Ser: 2.9 mg/dL — ABNORMAL HIGH (ref 0.61–1.24)
Creatinine, Ser: 3 mg/dL — ABNORMAL HIGH (ref 0.61–1.24)
Glucose, Bld: 52 mg/dL — ABNORMAL LOW (ref 70–99)
Glucose, Bld: 77 mg/dL (ref 70–99)
HCT: 19 % — ABNORMAL LOW (ref 39.0–52.0)
HCT: 20 % — ABNORMAL LOW (ref 39.0–52.0)
Hemoglobin: 6.5 g/dL — CL (ref 13.0–17.0)
Hemoglobin: 6.8 g/dL — CL (ref 13.0–17.0)
Potassium: 5.6 mmol/L — ABNORMAL HIGH (ref 3.5–5.1)
Potassium: 5.9 mmol/L — ABNORMAL HIGH (ref 3.5–5.1)
Sodium: 127 mmol/L — ABNORMAL LOW (ref 135–145)
Sodium: 129 mmol/L — ABNORMAL LOW (ref 135–145)
TCO2: 19 mmol/L — ABNORMAL LOW (ref 22–32)
TCO2: 22 mmol/L (ref 22–32)

## 2019-12-08 LAB — PHOSPHORUS: Phosphorus: 12.9 mg/dL — ABNORMAL HIGH (ref 2.5–4.6)

## 2019-12-08 LAB — LACTIC ACID, PLASMA: Lactic Acid, Venous: >11 mmol/L (ref 0.5–1.9)

## 2019-12-08 LAB — GLUCOSE, CAPILLARY: Glucose-Capillary: 131 mg/dL — ABNORMAL HIGH (ref 70–99)

## 2019-12-08 MED ORDER — SODIUM CHLORIDE 0.9 % IV SOLN: INTRAVENOUS | Status: DC

## 2019-12-08 MED ORDER — CALCIUM ACETATE (PHOS BINDER) 667 MG PO CAPS
1334.0000 mg | ORAL_CAPSULE | Freq: Three times a day (TID) | ORAL | Status: DC
  Administered 2019-12-08: 1334 mg via ORAL
  Filled 2019-12-08 (×2): qty 2

## 2019-12-09 LAB — CULTURE, BLOOD (ROUTINE X 2)
Culture: NO GROWTH
Culture: NO GROWTH

## 2019-12-20 NOTE — Progress Notes (Addendum)
Called [facility] per bedside RN request.  Referral number is [phone] Spoke with Jonetta Osgood who instructed than an organ donor coordinator would be in touch  Kyla Balzarine called back and further information given. [facility] will be in touch about possibility of organ donation    Update: Pt will not be suitable for donation after circulatory death per Kyla Balzarine at [facility]

## 2019-12-20 NOTE — Progress Notes (Signed)
eLink Physician-Brief Progress Note Patient Name: Edward Woods DOB: 31-Jul-1990 MRN: [id number]   Date of Service  12/02/2019  HPI/Events of Note  I called the liver transplant service at [facility] and spoke with Dr. Woodfin Ganja about possible transfer of patient to [facility] and any ideas for ongoing treatment, she indicated that [facility] had no beds. We reviewed the patient;s dat a together and she concluded that short of empirically giving him N-Acetylcysteine empirically, she had no further recommendations.  eICU Interventions  Continue ongoing supportive care. Family just made him DNR.        Thomasene Lot Maxey Ransom 11/26/2019, 2:46 AM

## 2019-12-20 NOTE — Progress Notes (Signed)
eLink Physician-Brief Progress Note Patient Name: Edward Woods DOB: [birth year] MRN: [id number]   Date of Service  12/12/2019  HPI/Events of Note  Phos 12.9  eICU Interventions  Phoslo ordered.        Thomasene Lot Rikita Grabert Dec 12, 2019, 1:33 AM

## 2019-12-20 NOTE — Progress Notes (Addendum)
Beginning around 1930, the patient showed hemodynamic instability. The patient's systolic blood pressure began to sustain in the 80's, so prn epi was given. Elink Dr. Warrick Parisian and Sheria Lang RN were notified that the patient was maxed on levo, vaso, and neo and now requiring epi pushes. MD did order labs which did result in 2 amps of bicarb, a calcium push, and multiple epi pushes. Pt was kept positive on the CRRT. The mother was notified about the patient's critical condition and was informed she would be notified if he worsened. Within the subsequent 1-2 hours the patient did begin to worsen. Multiple calls were placed to [facility] which resulted in a multitude of labs such as ABG and ionized calcium. Calcium was replaced as noted in the [facility]. Blood pressures eventually began to sustain in the 70's/80's so angiotensin II was started. EPI was also subsequently added. As per the author's 0125 note, Nephrology was notified just after midnight that the patient was not sustaining his BP and the decision was made to stop CRRT. Around this time, multiple attempts had been made to reach the family, by both the MD and nurse, to advise the family that they should come in. A nurse was able to reach a family member who was in turn able to reach the mother. The primary nurse did speak with the Mother, Rushie Goltz, and updated her on the situation. Sometime between 0100-0200, the mother did arrive to the hospital with a cousin named Minerva Areola. The MD camera'd in and spoke at length with the family about the patient's condition and goals of care. The family did not decide until almost 0300 to make the patient a DNR as per Dr. Jaynie Crumble note. Around [phone] the patient began to go into an arrhythmia with the nurse, mother, and cousin at the bedside. Blood pressures dropped to the 20-30's. At 0417 the monitor read asystole. Death was confirmed by Janice Coffin RN and [facility] May RN. A heart beat was not auscultated and there were no spontaneous respirations.  Pupils were fixed and non reactive. The patient's endotracheal tube was removed by the nuse and the dobhoff was also removed and the family was given time to greive. The family was given a patient placement card as they did not have a funeral home in mind. All belongings had been previously sent home with the family and all cabinets had been checked. Post mortem care was completed and pt placement was notified. Pt was transferred to the morgue.

## 2019-12-20 NOTE — Progress Notes (Signed)
Spoke with nephrology approx. 30-40 mins ago. Pt with HR 130's SBP 60's. Nephrologist gave verbal order to stop CRRT as it is no longer sustainable. Blood given back and system off.

## 2019-12-20 NOTE — Discharge Summary (Addendum)
Physician Death Summary  Patient ID: GAYLE COLLARD MRN: [id number] DOB/AGE: 01-24-1991 29 y.o.  Admit date: 12/31/19 Discharge date: 12/10/2019  Admission Diagnoses: Diabetic ketoacidosis Acute pancreatitis Acute encephalopathy Septic shock, present on admission, unknown organism  Discharge Diagnoses:  Multiorgan failure Acute hypoxic respiratory failure Shock Acute pancreatitis Metabolic acidosis Acute kidney injury requiring dialysis Acute pancreatitis Diabetes, DKA Acute metabolic encephalopathy Fulminant liver failure Paroxysmal atrial fibrillation Protein calorie malnutrition  Discharged Condition: Deceased  Hospital Course:  29 year old male admitted with diabetic ketoacidosis acute pancreatitis secondary to hypertriglyceridemia.  Initially treated with IV fluids insulin.  He also had acute kidney injury requiring initiation of dialysis on 5/14.  He continued to worsen in spite of aggressive treatment.  Intubated on 5/16 for worsening shock, renal failure.  He was also on broad-spectrum antibiotics including meropenem, vancomycin  He had profound shock with lactic acid greater than 11, on 5 pressors including giapressa in addition to neo, vaso, levo and epinephrine and bicarb drip.  He improved briefly but then worsened again with worsening shock, fulminant liver failure, thrombocytopenia from critical illness.  He underwent multiple imaging including CT head, chest abdomen pelvis, ultrasound which just showed necrotizing pancreatitis and no other drainable collection or process that could be intervened upon invasively.  On the night of 5/19 CRRT had to be held as his blood pressure would not tolerate.  Multiple discussions were had with family members including mother and father.  He was made DNR and passed away due to progressive multiorgan failure in early morning on 5/20  Chilton Greathouse MD [facility] Please see [email or web address] for pager details.   12/15/2019, 8:27 AM

## 2019-12-20 NOTE — Progress Notes (Signed)
eLink Physician-Brief Progress Note Patient Name: Edward Woods DOB: 1991/01/05 MRN: [id number]   Date of Service  12/09/2019  HPI/Events of Note  Patient's family came in given his grave prognosis, after reviewing his clinical status with his mother she elected to make him a DNR.  eICU Interventions  DNR order entered.        [name] 11/24/2019, 2:44 AM

## 2019-12-20 DEATH — deceased

## 2021-07-09 IMAGING — US US ABDOMEN LIMITED
1 series · 14 of 25 positions shown · non-contrast
Comparison: CT from earlier in the same day.

CLINICAL DATA: Acute pancreatitis

EXAM:
ULTRASOUND ABDOMEN LIMITED RIGHT UPPER QUADRANT

[Series 1: us abdomen limited · 14 of 51 slices shown]
[im 1/51]
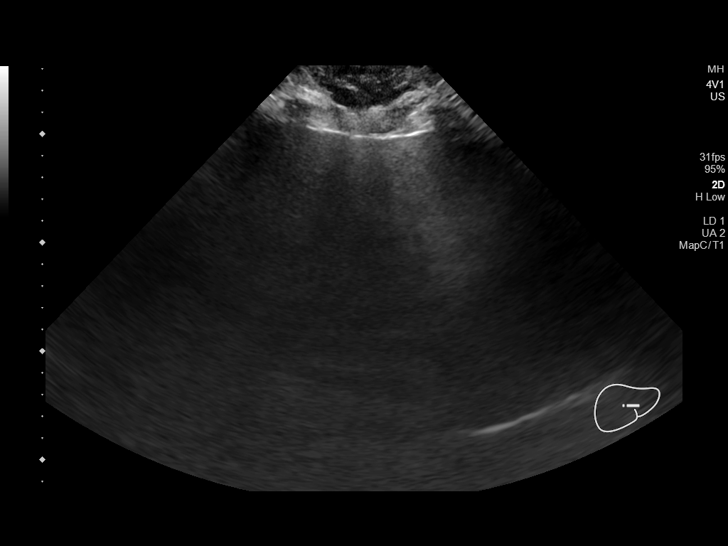
[im 5/51]
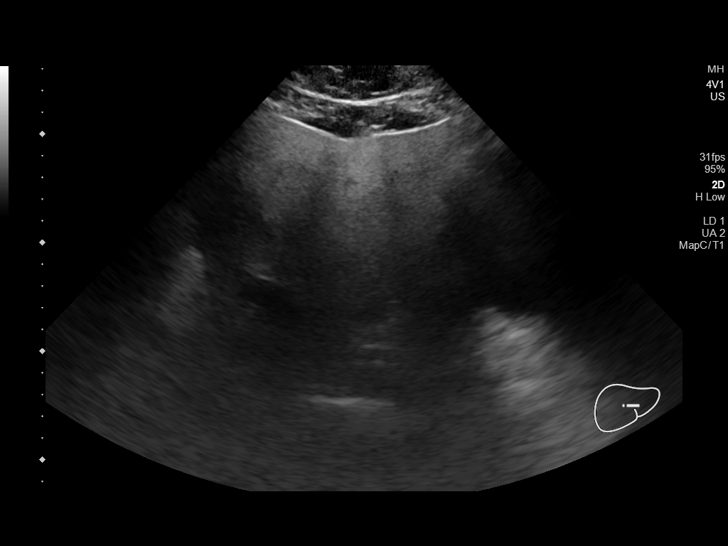
[im 9/51]
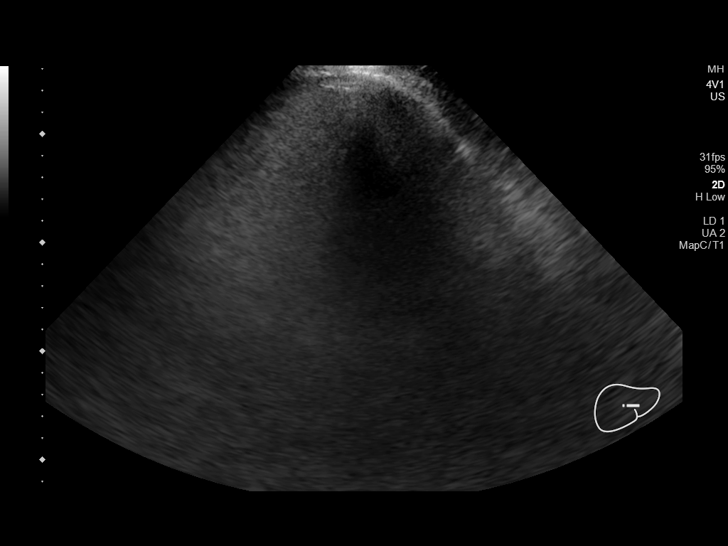
[im 13/51]
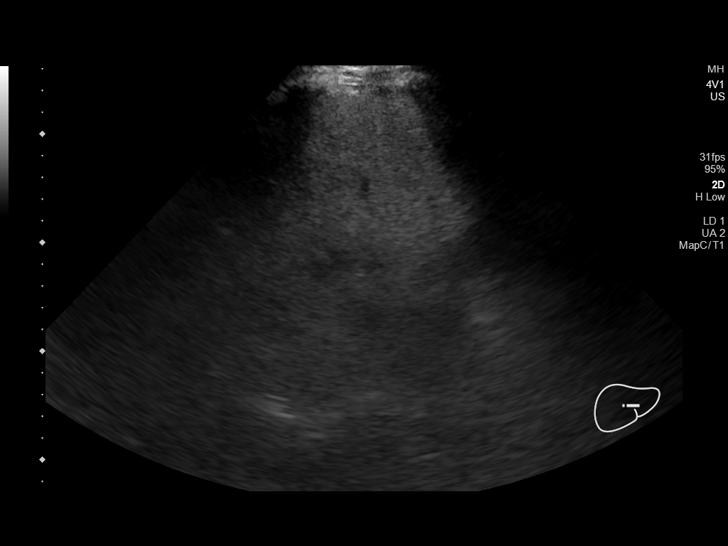
[im 17/51]
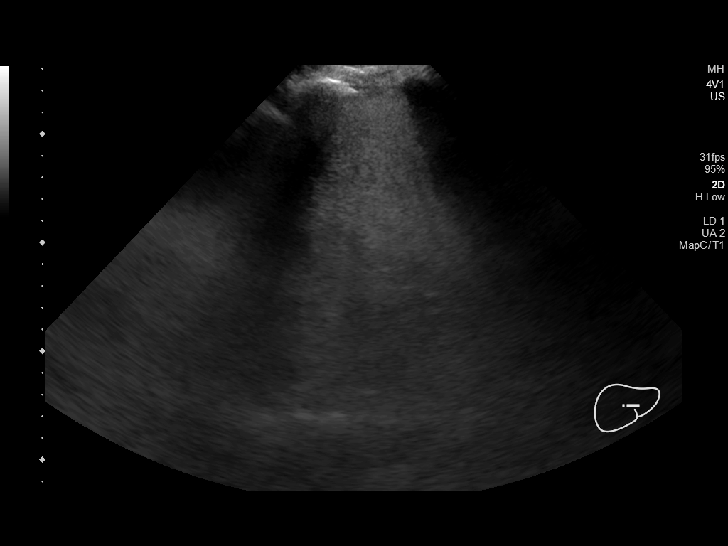
[im 19/51]
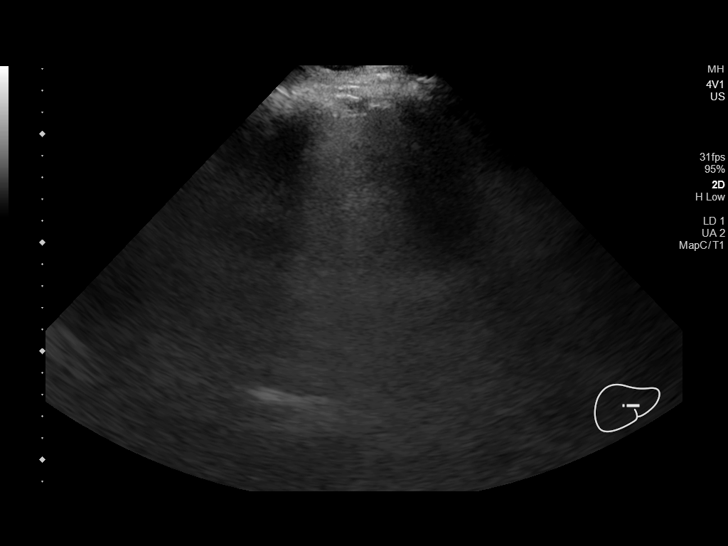
[im 23/51]
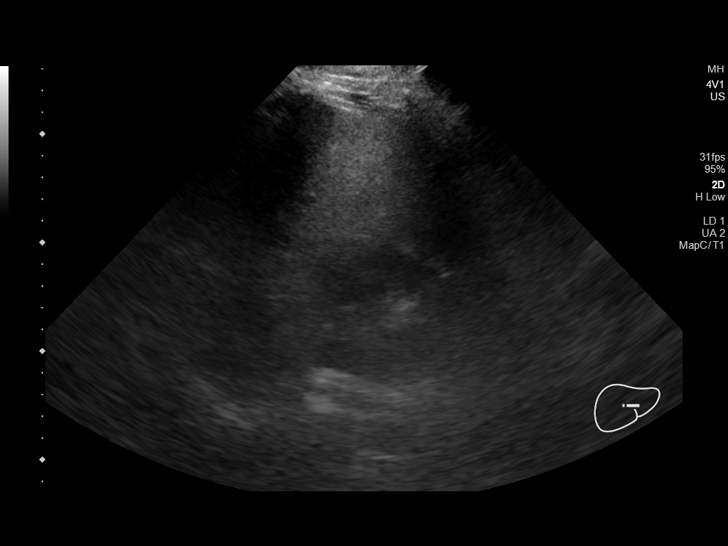
[im 28/51]
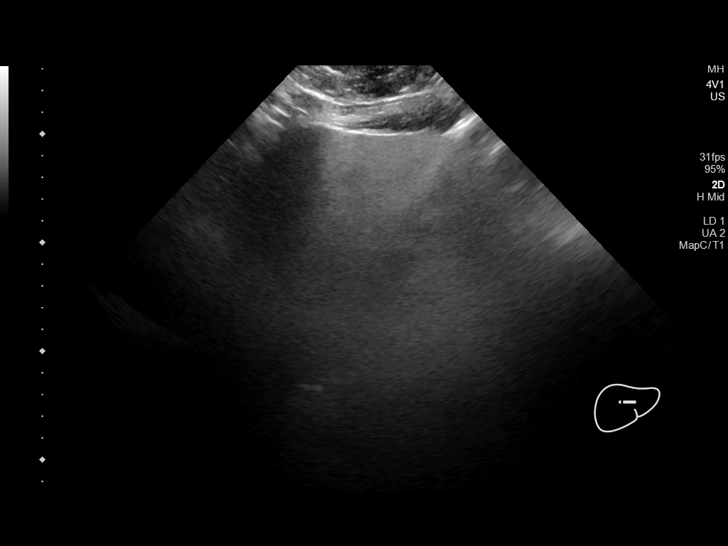
[im 32/51]
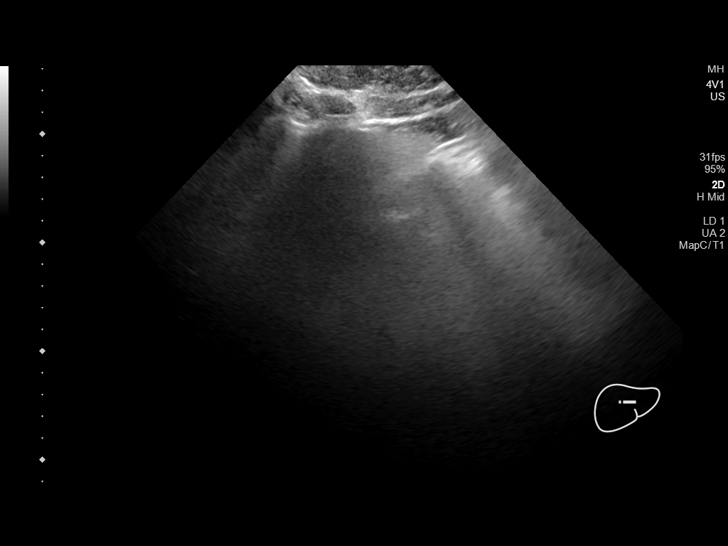
[im 34/51]
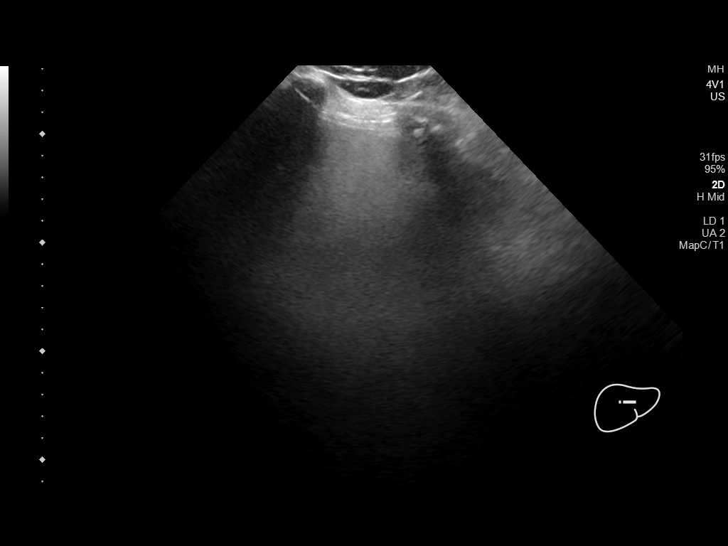
[im 38/51]
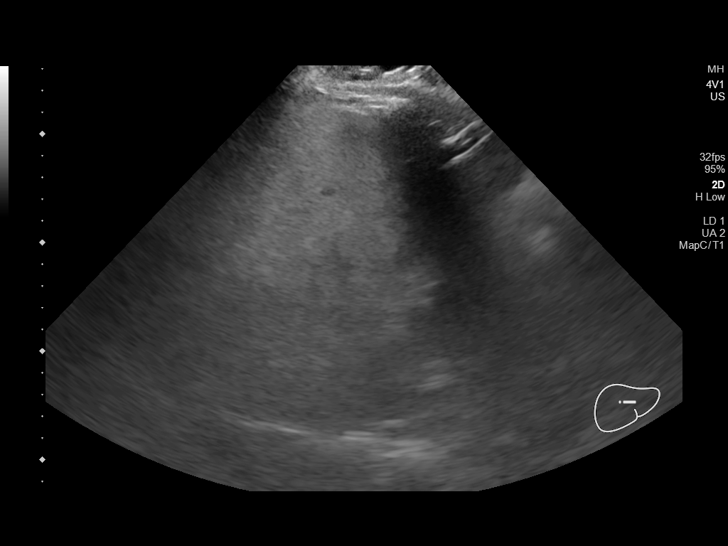
[im 42/51]
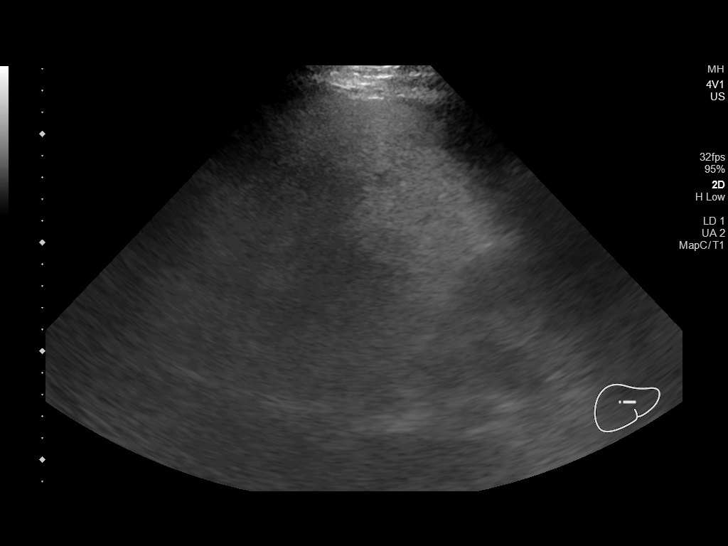
[im 46/51]
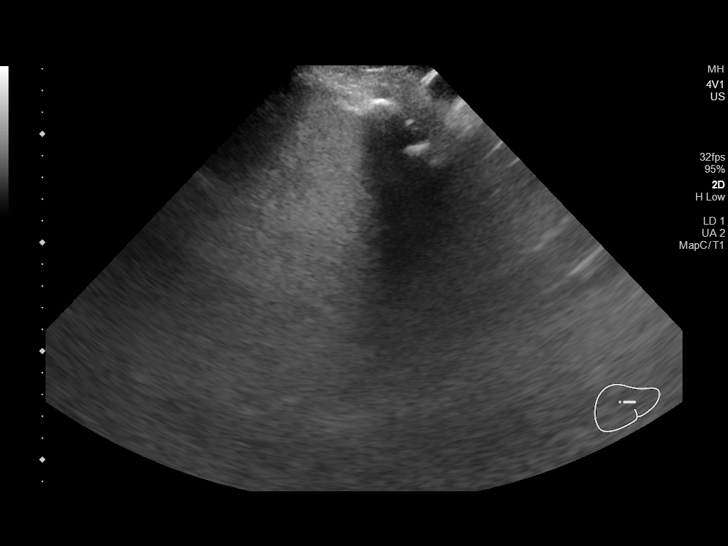
[im 51/51]
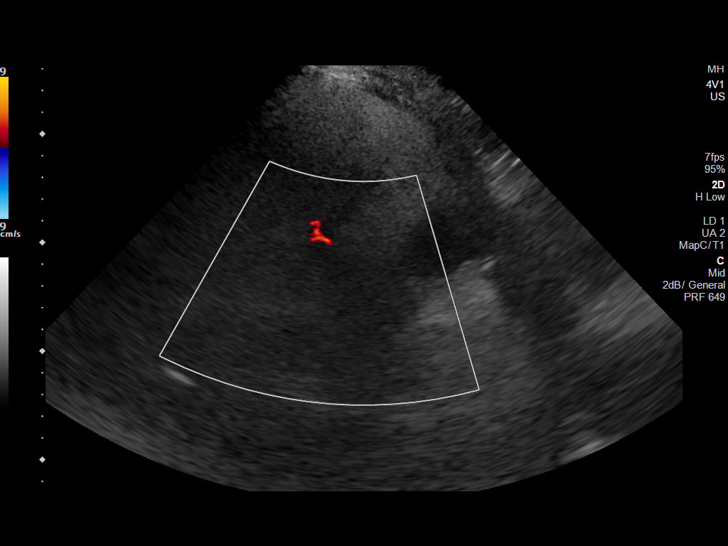

[14 of 25 positions shown; findings below may reference images not displayed]

FINDINGS: Gallbladder:

Not well visualized due to overlying bowel gas.

Common bile duct:

Diameter: Well visualized.

Liver:

Diffusely increased in echogenicity consistent with fatty
infiltration. Portal vein is patent on color Doppler imaging with
normal direction of blood flow towards the liver.

Other: None.
IMPRESSION: Fatty liver.

Significant overlying bowel gas limits the remainder of the exam.

## 2021-07-09 IMAGING — CT CT ABD-PELV W/O CM
2 of 4 series · 16 of 46 positions shown, 18 images · non-contrast
Comparison: None.

CLINICAL DATA: Abdominal pain

EXAM:
CT ABDOMEN AND PELVIS WITHOUT CONTRAST
TECHNIQUE: Multidetector CT imaging of the abdomen and pelvis was performed
following the standard protocol without IV contrast.

[Series 2: axial st · axial · 0.79mm/px · z∈[+1140,+1620]mm · 13 of 110 slices shown, 15 images]
[im 7/110  soft-tissue]
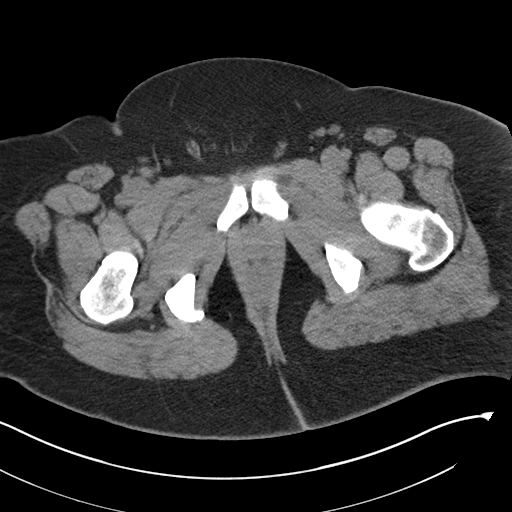
[im 7/110  bone]
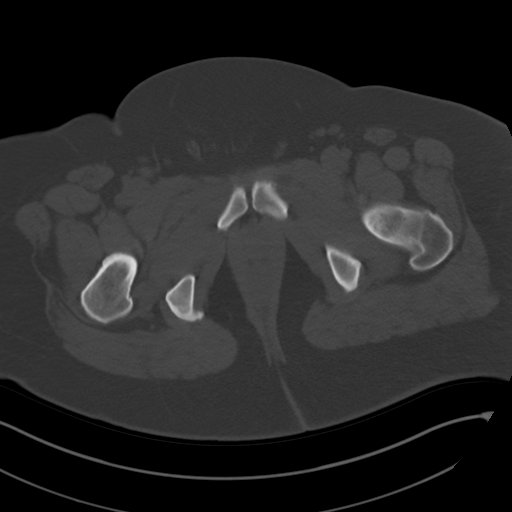
[im 13/110  soft-tissue]
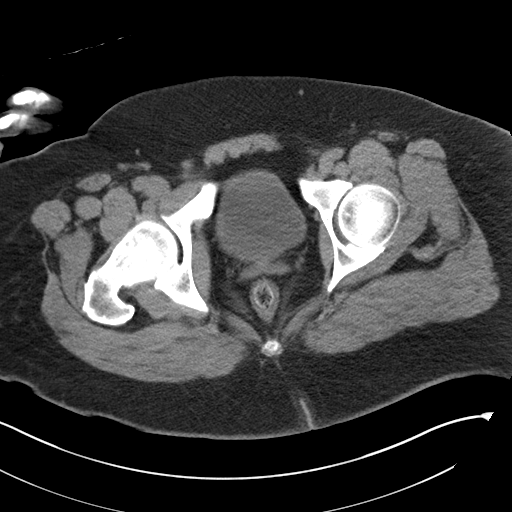
[im 25/110  soft-tissue]
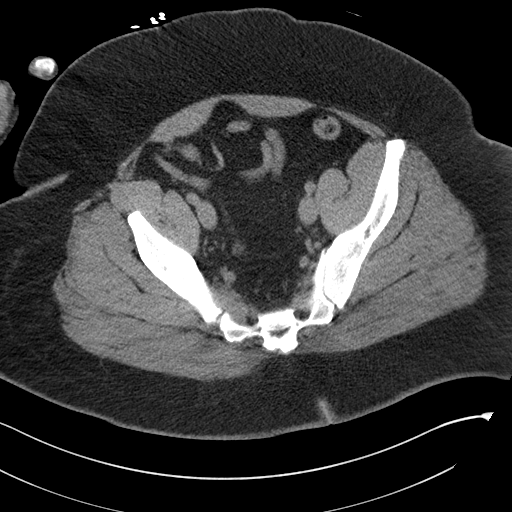
[im 31/110  soft-tissue]
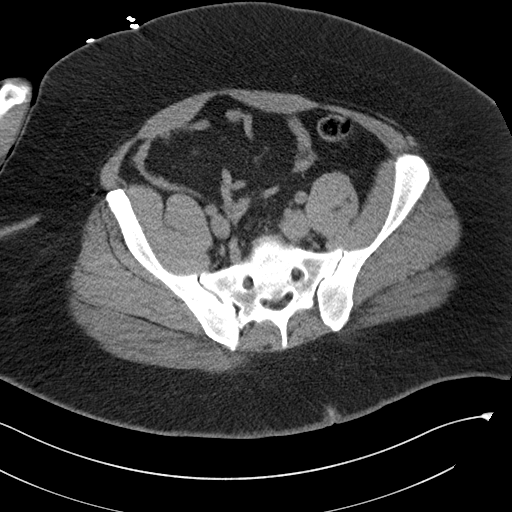
[im 37/110  soft-tissue]
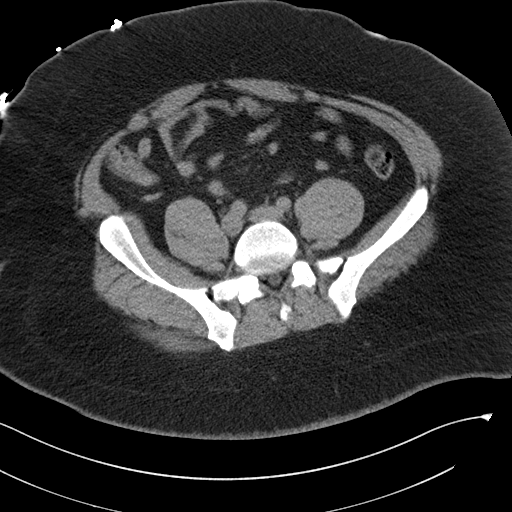
[im 49/110  soft-tissue]
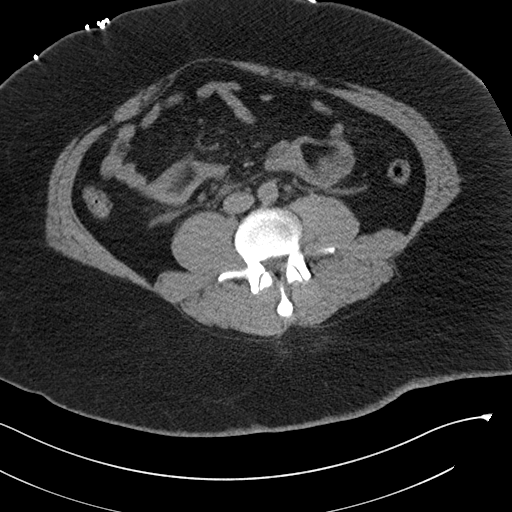
[im 55/110  soft-tissue]
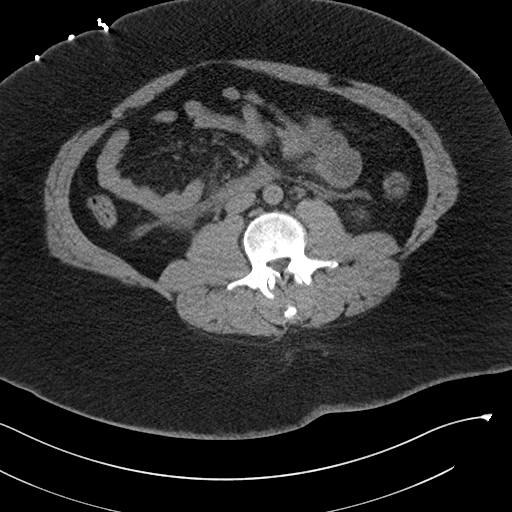
[im 61/110  soft-tissue]
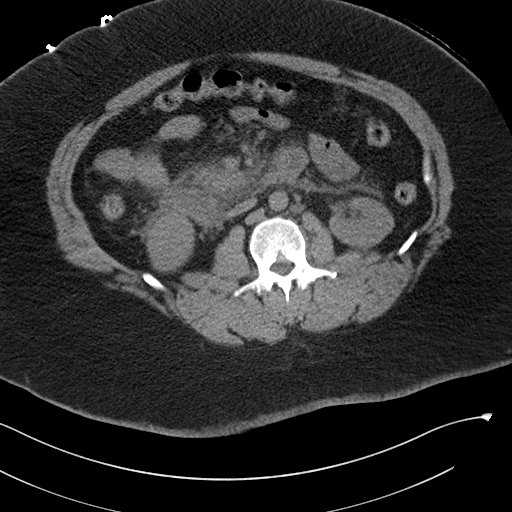
[im 73/110  soft-tissue]
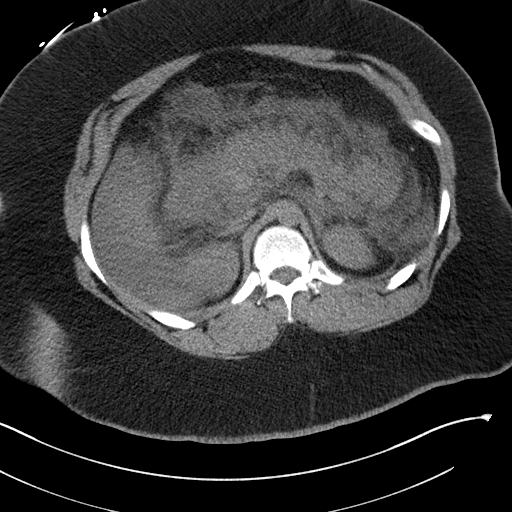
[im 73/110  bone]
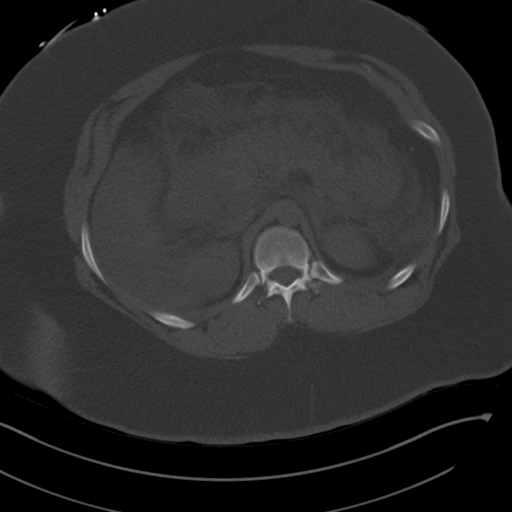
[im 79/110  soft-tissue]
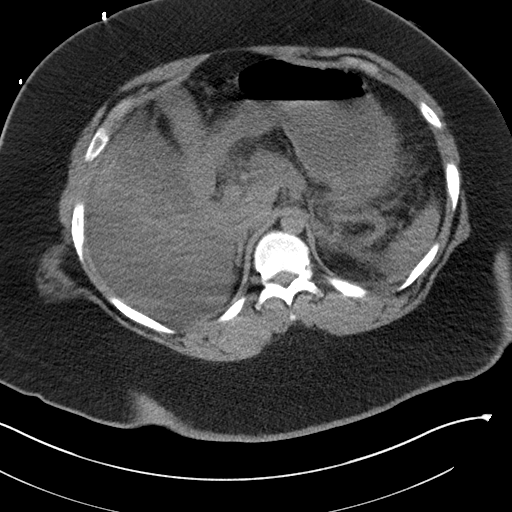
[im 85/110  soft-tissue]
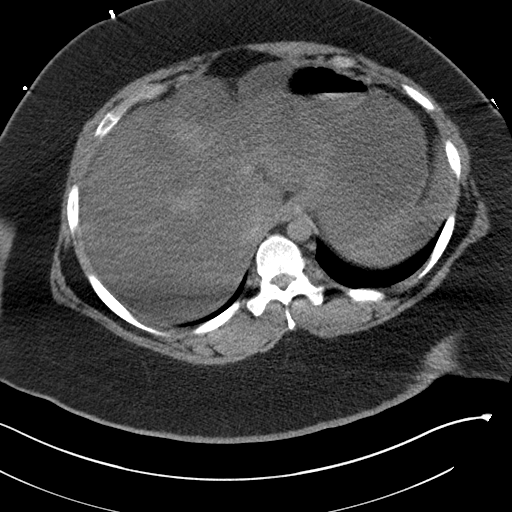
[im 97/110  soft-tissue]
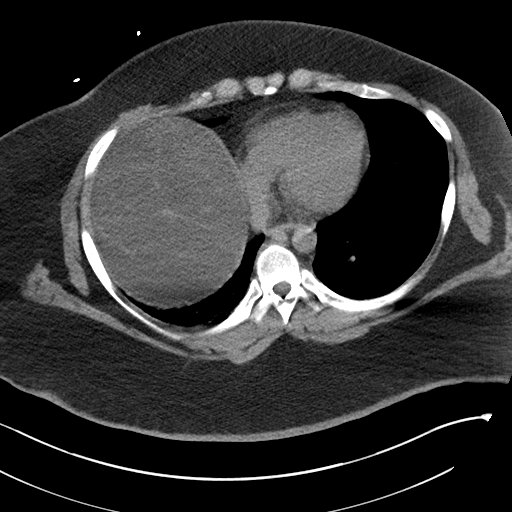
[im 103/110  soft-tissue]
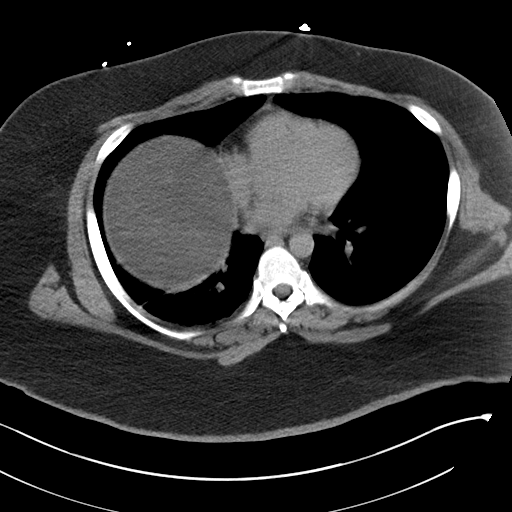

[Series 5: coronal st · coronal · 0.96mm/px · 3 of 170 slices shown]
[im 57/170  soft-tissue]
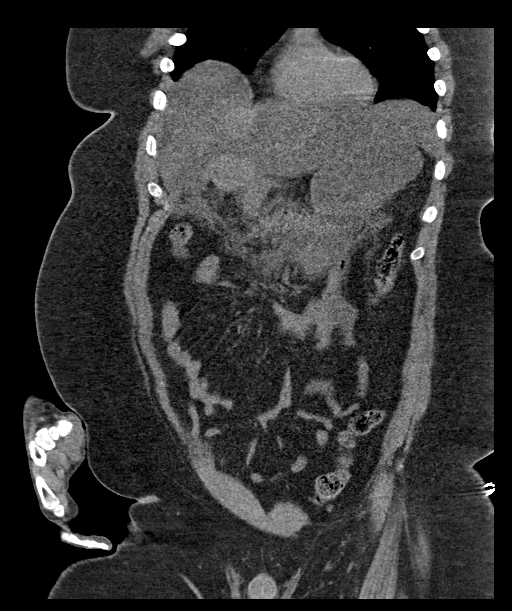
[im 76/170  soft-tissue]
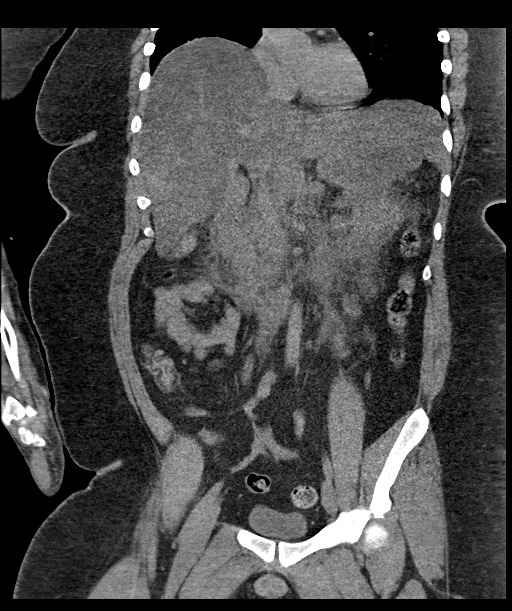
[im 94/170  soft-tissue]
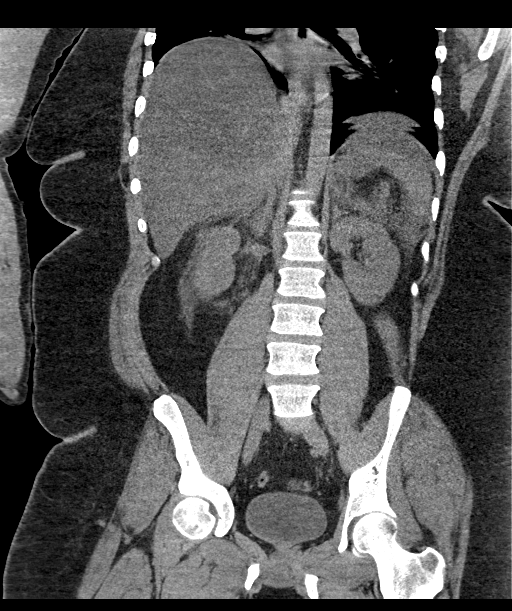

[16 of 46 positions shown; findings below may reference images not displayed]

FINDINGS: Lower chest: No acute abnormality.

Hepatobiliary: Markedly decreased attenuation of the hepatic
parenchyma compatible with hepatic steatosis. No for focal liver
lesion. Gallbladder appears unremarkable. No hyperdense gallstone.
No biliary dilatation.

Pancreas: Diffuse pancreatic enlargement with extensive
peripancreatic stranding and surrounding peripancreatic fluid. No
organized fluid collection. No pancreatic ductal dilatation.

Spleen: Normal in size without focal abnormality.

Adrenals/Urinary Tract: Adrenal glands are unremarkable. Kidneys are
normal, without renal calculi, focal lesion, or hydronephrosis.
Bladder is unremarkable.

Stomach/Bowel: Stomach is within normal limits. Appendix appears
normal (series 2, image 74). No evidence of bowel wall thickening,
distention, or inflammatory changes.

Vascular/Lymphatic: No significant vascular findings are present. No
enlarged abdominal or pelvic lymph nodes.

Reproductive: Prostate is unremarkable.

Other: No free fluid within the pelvis. No pneumoperitoneum. No
abdominal wall hernia.

Musculoskeletal: No acute or significant osseous findings.
IMPRESSION: 1. Acute pancreatitis. No organized fluid collection. Please note
that evaluation for parenchymal necrosis is limited in the absence
of intravenous contrast.
2. Hepatic steatosis.
